# Patient Record
Sex: Female | Born: 1985 | Race: White | Hispanic: Yes | Marital: Married | Smoking: Current every day smoker
Health system: Southern US, Community
[De-identification: ages and names within clinical notes are randomized; demographics above are authoritative.]

## PROBLEM LIST (undated history)

## (undated) DIAGNOSIS — F419 Anxiety disorder, unspecified: Secondary | ICD-10-CM

## (undated) DIAGNOSIS — E079 Disorder of thyroid, unspecified: Secondary | ICD-10-CM

## (undated) DIAGNOSIS — E559 Vitamin D deficiency, unspecified: Secondary | ICD-10-CM

## (undated) HISTORY — DX: Vitamin D deficiency, unspecified: E55.9

## (undated) HISTORY — DX: Disorder of thyroid, unspecified: E07.9

## (undated) HISTORY — PX: EYE SURGERY: SHX253

## (undated) HISTORY — DX: Anxiety disorder, unspecified: F41.9

---

## 2014-03-04 HISTORY — PX: TUBAL LIGATION: SHX77

## 2015-01-03 ENCOUNTER — Ambulatory Visit (INDEPENDENT_AMBULATORY_CARE_PROVIDER_SITE_OTHER): Payer: BLUE CROSS/BLUE SHIELD | Admitting: Family Medicine

## 2015-01-03 ENCOUNTER — Encounter: Payer: Self-pay | Admitting: Family Medicine

## 2015-01-03 VITALS — BP 120/80 | HR 84 | Temp 98.6°F | Resp 16 | Ht 61.0 in | Wt 134.2 lb

## 2015-01-03 DIAGNOSIS — E079 Disorder of thyroid, unspecified: Secondary | ICD-10-CM | POA: Insufficient documentation

## 2015-01-03 DIAGNOSIS — E041 Nontoxic single thyroid nodule: Secondary | ICD-10-CM

## 2015-01-03 DIAGNOSIS — E049 Nontoxic goiter, unspecified: Secondary | ICD-10-CM | POA: Diagnosis not present

## 2015-01-03 NOTE — Progress Notes (Signed)
Name: Tamara NobleCynthia Monty   MRN: [id number]    DOB: [birth year]   Date:01/03/2015       Progress Note  Subjective  Chief Complaint  Chief Complaint  Patient presents with  . Thyroid Nodule    new patient to get established, seen in ER would like a referral to Endocrinology    Thyroid Problem Presents for follow-up visit. Symptoms include cold intolerance (' I'm cold all the time') and fatigue. Patient reports no palpitations or weight loss. ([place] [facility] for slowly enlarging mass in the neck. TSH was elevated, Free T4 was suppressed, and Thyroid Peroxidase Ab was elevated. Advised to obtain a referral from the PCP to be seen by Endocrinology.) Past treatments include nothing.  No Hx of thyroid disease in the family.  CTA of neck showed a homogeneously enlarged thyroid gland with a 4 mm nodule in posterior right lobe. History reviewed. No pertinent past medical history.  Past Surgical History  Procedure Laterality Date  . Cesarean section    . Eye surgery      Family History  Problem Relation Age of Onset  . Dementia Father     Social History   Social History  . Marital Status: Married    Spouse Name: N/A  . Number of Children: N/A  . Years of Education: N/A   Occupational History  . Not on file.   Social History Main Topics  . Smoking status: Current Every Day Smoker -- 0.25 packs/day    Types: Cigarettes  . Smokeless tobacco: Not on file  . Alcohol Use: No     Comment: occasional 1-2x/yr  . Drug Use: No  . Sexual Activity:    Partners: Male   Other Topics Concern  . Not on file   Social History Narrative  . No narrative on file    No current outpatient prescriptions on file.  No Known Allergies   Review of Systems  Constitutional: Positive for malaise/fatigue and fatigue. Negative for fever, chills and weight loss.  Eyes: Negative for blurred vision.  Cardiovascular: Negative for chest pain and palpitations.  Gastrointestinal: Negative for  abdominal pain.  Neurological: Negative for headaches.  Endo/Heme/Allergies: Positive for cold intolerance (' I'm cold all the time').    Objective  Filed Vitals:   01/03/15 0848  BP: 120/80  Pulse: 84  Temp: 98.6 F (37 C)  TempSrc: Oral  Resp: 16  Height: 5\' 1"  (1.549 m)  Weight: 134 lb 3.2 oz (60.873 kg)  SpO2: 98%    Physical Exam  Constitutional: She is oriented to person, place, and time and well-developed, well-nourished, and in no distress.  HENT:  Head: Normocephalic and atraumatic.  Mouth/Throat: Posterior oropharyngeal erythema present.  Neck: Thyroid mass and thyromegaly (enlarged thyroid gland, mildly tender to palpation) present.    Cardiovascular: Normal rate, regular rhythm and normal heart sounds.   Pulmonary/Chest: Effort normal and breath sounds normal. No stridor. She has no wheezes. She has no rales.  Abdominal: Soft. Bowel sounds are normal. There is no tenderness.  Neurological: She is alert and oriented to person, place, and time.  Skin: Skin is warm and dry.  Nursing note and vitals reviewed.   Assessment & Plan  1. Goiter [facility] provided. - Ambulatory referral to Endocrinology  2. Thyroid nodule Further workup and assessment by endocrinology. - Ambulatory referral to Endocrinology   [facility] Asad A. Faylene KurtzShah [facility] 01/03/2015 9:16 AM

## 2015-01-09 ENCOUNTER — Ambulatory Visit (INDEPENDENT_AMBULATORY_CARE_PROVIDER_SITE_OTHER): Payer: BLUE CROSS/BLUE SHIELD | Admitting: Family Medicine

## 2015-01-09 ENCOUNTER — Encounter: Payer: Self-pay | Admitting: Family Medicine

## 2015-01-09 VITALS — BP 120/77 | HR 91 | Temp 98.9°F | Resp 19 | Ht 61.0 in | Wt 133.8 lb

## 2015-01-09 DIAGNOSIS — J0110 Acute frontal sinusitis, unspecified: Secondary | ICD-10-CM

## 2015-01-09 MED ORDER — AMOXICILLIN-POT CLAVULANATE 875-125 MG PO TABS: 1.0000 | ORAL_TABLET | Freq: Two times a day (BID) | ORAL | Status: DC

## 2015-01-09 MED ORDER — FLUTICASONE PROPIONATE 50 MCG/ACT NA SUSP: 2.0000 | Freq: Every day | NASAL | Status: DC

## 2015-01-09 NOTE — Progress Notes (Signed)
Name: Tamara Walker   MRN: [id number]    DOB: Jul 09, 1985   Date:01/09/2015       Progress Note  Subjective  Chief Complaint  Chief Complaint  Patient presents with  . Acute Visit    Respitory Issue x4 days    URI  This is a new problem. Episode onset: 4 days ago. There has been no fever. Associated symptoms include congestion, coughing, sinus pain and a sore throat. She has tried decongestant for the symptoms.   History reviewed. No pertinent past medical history.  Past Surgical History  Procedure Laterality Date  . Cesarean section    . Eye surgery      Family History  Problem Relation Age of Onset  . Dementia Father     Social History   Social History  . Marital Status: Married    Spouse Name: N/A  . Number of Children: N/A  . Years of Education: N/A   Occupational History  . Not on file.   Social History Main Topics  . Smoking status: Current Every Day Smoker -- 0.25 packs/day    Types: Cigarettes  . Smokeless tobacco: Not on file  . Alcohol Use: No     Comment: occasional 1-2x/yr  . Drug Use: No  . Sexual Activity:    Partners: Male   Other Topics Concern  . Not on file   Social History Narrative     Current outpatient prescriptions:  .  levothyroxine (SYNTHROID, LEVOTHROID) 100 MCG tablet, Take 1 tablet by mouth daily., Disp: , Rfl:   No Known Allergies   Review of Systems  Constitutional: Positive for chills. Negative for fever.  HENT: Positive for congestion and sore throat.   Respiratory: Positive for cough.     Objective  Filed Vitals:   01/09/15 1115  BP: 120/77  Pulse: 91  Temp: 98.9 F (37.2 C)  TempSrc: Oral  Resp: 19  Height: 5\' 1"  (1.549 m)  Weight: 133 lb 12.8 oz (60.691 kg)  SpO2: 98%    Physical Exam  Constitutional: She is well-developed, well-nourished, and in no distress.  HENT:  Head: Normocephalic and atraumatic.  Right Ear: Tympanic membrane and ear canal normal.  Left Ear: Tympanic membrane and ear canal  normal.  Nose: Right sinus exhibits frontal sinus tenderness. Right sinus exhibits no maxillary sinus tenderness. Left sinus exhibits frontal sinus tenderness. Left sinus exhibits no maxillary sinus tenderness.  Mouth/Throat: Posterior oropharyngeal erythema present. No oropharyngeal exudate.  Neck: Thyromegaly present.  No palpable lymphadenopathy.  Cardiovascular: Normal rate, regular rhythm and normal heart sounds.   Pulmonary/Chest: Effort normal and breath sounds normal. She has no rales.  Nursing note and vitals reviewed.   Assessment & Plan  1. Acute frontal sinusitis, recurrence not specified Symptoms consistent with acute sinusitis. Will start on antibiotic therapy and Flonase for relief. Advised to increase fluid intake. Follow-up if no clinical improvement. - amoxicillin-clavulanate (AUGMENTIN) 875-125 MG tablet; Take 1 tablet by mouth 2 (two) times daily.  Dispense: 20 tablet; Refill: 0 - fluticasone (FLONASE) 50 MCG/ACT nasal spray; Place 2 sprays into both nostrils daily.  Dispense: 16 g; Refill: 6   Cheronda Erck Asad A. Faylene KurtzShah [facility] 01/09/2015 11:59 AM

## 2015-03-07 ENCOUNTER — Ambulatory Visit (INDEPENDENT_AMBULATORY_CARE_PROVIDER_SITE_OTHER): Payer: BLUE CROSS/BLUE SHIELD | Admitting: Certified Nurse Midwife

## 2015-03-07 ENCOUNTER — Encounter: Payer: Self-pay | Admitting: Certified Nurse Midwife

## 2015-03-07 VITALS — BP 120/91 | HR 80 | Ht 62.0 in | Wt 134.1 lb

## 2015-03-07 DIAGNOSIS — A499 Bacterial infection, unspecified: Secondary | ICD-10-CM

## 2015-03-07 DIAGNOSIS — N760 Acute vaginitis: Secondary | ICD-10-CM

## 2015-03-07 DIAGNOSIS — B9689 Other specified bacterial agents as the cause of diseases classified elsewhere: Secondary | ICD-10-CM

## 2015-03-07 MED ORDER — METRONIDAZOLE 500 MG PO TABS: 500.0000 mg | ORAL_TABLET | Freq: Two times a day (BID) | ORAL | Status: DC

## 2015-03-07 NOTE — Patient Instructions (Signed)

## 2015-03-07 NOTE — Progress Notes (Signed)
Subjective:     Patient ID: Tamara Walker, female   DOB: [birth year], 30 y.o.   MRN: [id number]  HPI Patient complains of a vaginal odor for 7 days. She just started her period today.  Nothing helps the vaginal odor.  She has had BV in the past.  She denies itch.  She denies any need for STI cultures.  She states her and her husband are monogamous.  Review of Systems  Constitutional: Negative for fever.  Respiratory: Negative for cough.   Cardiovascular: Negative for chest pain.  Gastrointestinal: Negative for abdominal pain, constipation and abdominal distention.  Genitourinary: Negative for flank pain, vaginal bleeding, vaginal discharge, difficulty urinating, genital sores, menstrual problem and pelvic pain.  Musculoskeletal: Negative for back pain.       Objective:   Physical Exam  Constitutional: She is oriented to person, place, and time. She appears well-developed and well-nourished.  Abdominal: Soft. She exhibits no distension. There is no tenderness. There is no rebound, no guarding and no CVA tenderness. Hernia confirmed negative in the right inguinal area and confirmed negative in the left inguinal area.  Genitourinary: There is no rash, tenderness or lesion on the right labia. There is no rash, tenderness or lesion on the left labia. Uterus is not enlarged and not tender. Cervix exhibits no motion tenderness and no discharge. Right adnexum displays no mass and no tenderness. Left adnexum displays no mass and no tenderness. There is bleeding in the vagina. No tenderness in the vagina. No signs of injury around the vagina. Vaginal discharge found.  Lymphadenopathy:       Right: No inguinal adenopathy present.       Left: No inguinal adenopathy present.  Neurological: She is alert and oriented to person, place, and time. She has normal reflexes.  Skin: Skin is warm and dry.  Psychiatric: She has a normal mood and affect. Her behavior is normal. Judgment and thought content normal.   Nursing note and vitals reviewed.      Assessment:     BV     Plan:     Flagyl  A total of 30 minutes were spent face-to-face with the patient during the encounter with greater than 50% dealing with counseling and coordination of care.

## 2015-04-28 ENCOUNTER — Ambulatory Visit (INDEPENDENT_AMBULATORY_CARE_PROVIDER_SITE_OTHER): Payer: BLUE CROSS/BLUE SHIELD | Admitting: Obstetrics and Gynecology

## 2015-04-28 ENCOUNTER — Encounter: Payer: Self-pay | Admitting: Obstetrics and Gynecology

## 2015-04-28 ENCOUNTER — Other Ambulatory Visit: Payer: Self-pay | Admitting: Obstetrics and Gynecology

## 2015-04-28 VITALS — BP 108/76 | HR 72 | Ht 61.5 in | Wt 140.1 lb

## 2015-04-28 DIAGNOSIS — Z01419 Encounter for gynecological examination (general) (routine) without abnormal findings: Secondary | ICD-10-CM

## 2015-04-28 NOTE — Progress Notes (Signed)
Subjective:   Tamara Walker is a 30 y.o. [phone] African American female here for a routine well-woman exam.  Patient's last menstrual period was 03/07/2015 (exact date).    Current complaints: none PCP: Sherryll Burger       Doesn't need labs  Social History: Sexual: heterosexual Marital Status: married Living situation: with family Occupation: homemaker Tobacco/alcohol: no alcohol use Illicit drugs: no history of illicit drug use  The following portions of the patient's history were reviewed and updated as appropriate: allergies, current medications, past family history, past medical history, past social history, past surgical history and problem list.  Past Medical History Past Medical History  Diagnosis Date  . Thyroid disease     Past Surgical History Past Surgical History  Procedure Laterality Date  . Cesarean section    . Eye surgery    . Tubal ligation  2016    Gynecologic History [id number]  Patient's last menstrual period was 03/07/2015 (exact date). Contraception: tubal ligation Last Pap: 2015. Results were: normal *  Obstetric History OB History  Gravida Para Term Preterm AB SAB TAB Ectopic Multiple Living  # Outcome Date GA Lbr Len/2nd Weight Sex Delivery Anes PTL Lv  5 Term 04/11/14    Judie Petit CS-Unspec   Y  4 TAB 2013 [redacted]w[redacted]d         3 Term 08/15/08    F Vag-Spont   Y  2 Term 04/07/05    F Vag-Spont   Y  1 SAB 2004 [redacted]w[redacted]d             Current Medications Current Outpatient Prescriptions on File Prior to Visit  Medication Sig Dispense Refill  . levothyroxine (SYNTHROID, LEVOTHROID) 100 MCG tablet Take 1 tablet by mouth daily.     No current facility-administered medications on file prior to visit.    Review of Systems Patient denies any headaches, blurred vision, shortness of breath, chest pain, abdominal pain, problems with bowel movements, urination, or intercourse.  Objective:  BP 108/76 mmHg  Pulse 72  Ht 5' 1.5" (1.562 m)  Wt 140 lb 1.6  oz (63.549 kg)  BMI 26.05 kg/m2  LMP 03/07/2015 (Exact Date)  Breastfeeding? No Physical Exam  General:  Well developed, well nourished, no acute distress. She is alert and oriented x3. Skin:  Warm and dry Neck:  Midline trachea, no thyromegaly or nodules Cardiovascular: Regular rate and rhythm, no murmur heard Lungs:  Effort normal, all lung fields clear to auscultation bilaterally Breasts:  No dominant palpable mass, retraction, or nipple discharge Abdomen:  Soft, non tender, no hepatosplenomegaly or masses Pelvic:  External genitalia is normal in appearance.  The vagina is normal in appearance. The cervix is bulbous, no CMT.  Thin prep pap is done . Uterus is felt to be normal size, shape, and contour.  No adnexal masses or tenderness noted. White copiuos d/c noted Microscopic wet-mount exam shows negative for pathogens, normal epithelial cells.  Extremities:  No swelling or varicosities noted Psych:  She has a normal mood and affect  Assessment:   Healthy well-woman exam  Plan:  Reassured, pap obtained F/U 1 year for AE, or sooner if needed s  Melody Suzan Nailer, CNM

## 2015-04-28 NOTE — Patient Instructions (Signed)
  Place annual gynecologic exam patient instructions here.  Thank you for enrolling in MyChart. Please follow the instructions below to securely access your online medical record. MyChart allows you to send messages to your doctor, view your test results, manage appointments, and more.   How Do I Sign Up? 1. In your Internet browser, go to [employer] and enter [email or web address]. 2. Click on the Sign Up Now link in the Sign In box. You will see the New Member Sign Up page. 3. Enter your MyChart Access Code exactly as it appears below. You will not need to use this code after you've completed the sign-up process. If you do not sign up before the expiration date, you must request a new code.  MyChart Access Code: [email or web address] Expires: 05/05/2015  3:06 AM  4. Enter your Social Security Number ([email or web address]) and Date of Birth (mm/dd/yyyy) as indicated and click Submit. You will be taken to the next sign-up page. 5. Create a MyChart ID. This will be your MyChart login ID and cannot be changed, so think of one that is secure and easy to remember. 6. Create a MyChart password. You can change your password at any time. 7. Enter your Password Reset Question and Answer. This can be used at a later time if you forget your password.  8. Enter your e-mail address. You will receive e-mail notification when new information is available in MyChart. 9. Click Sign Up. You can now view your medical record.   Additional Information Remember, MyChart is NOT to be used for urgent needs. For medical emergencies, dial 911.

## 2015-05-02 LAB — CYTOLOGY - PAP

## 2015-10-31 ENCOUNTER — Encounter: Payer: Self-pay | Admitting: Family Medicine

## 2015-10-31 ENCOUNTER — Ambulatory Visit (INDEPENDENT_AMBULATORY_CARE_PROVIDER_SITE_OTHER): Payer: BLUE CROSS/BLUE SHIELD | Admitting: Family Medicine

## 2015-10-31 VITALS — BP 103/66 | HR 75 | Temp 98.0°F | Resp 15 | Ht 62.0 in | Wt 138.6 lb

## 2015-10-31 DIAGNOSIS — E063 Autoimmune thyroiditis: Secondary | ICD-10-CM | POA: Diagnosis not present

## 2015-10-31 DIAGNOSIS — E039 Hypothyroidism, unspecified: Secondary | ICD-10-CM

## 2015-10-31 NOTE — Progress Notes (Signed)
Name: Tamara NobleCynthia Sova   MRN: [id number]    DOB: Apr 05, 1985   Date:10/31/2015       Progress Note  Subjective  Chief Complaint  Chief Complaint  Patient presents with  . Labs Only    Thyroid level recheck    HPI  Pt. Presents for rechecking thyroid levels, being followed by Dr. Tedd SiasSolum (Endocrinology) after she was referred for goiter, TSH was elevated and was started on Levothyroxine 100 mcg every morning. She feels that her symptoms of fatigue and cold intolerance are still there but constipation has improved.    Past Medical History:  Diagnosis Date  . Thyroid disease     Past Surgical History:  Procedure Laterality Date  . CESAREAN SECTION    . EYE SURGERY    . TUBAL LIGATION  2016    Family History  Problem Relation Age of Onset  . Dementia Father   . Diabetes Maternal Grandfather   . Diabetes Paternal Grandmother   . Cancer Neg Hx   . Heart disease Neg Hx     Social History   Social History  . Marital status: Married    Spouse name: N/A  . Number of children: N/A  . Years of education: N/A   Occupational History  . Not on file.   Social History Main Topics  . Smoking status: Current Every Day Smoker    Packs/day: 0.25    Types: Cigarettes  . Smokeless tobacco: Never Used  . Alcohol use No     Comment: occasional 1-2x/yr  . Drug use: No  . Sexual activity: Yes    Partners: Male    Birth control/ protection: Surgical   Other Topics Concern  . Not on file   Social History Narrative  . No narrative on file     Current Outpatient Prescriptions:  .  levothyroxine (SYNTHROID, LEVOTHROID) 100 MCG tablet, Take 1 tablet by mouth daily., Disp: , Rfl:   No Known Allergies   Review of Systems  Constitutional: Positive for malaise/fatigue. Negative for chills and fever.  Gastrointestinal: Positive for constipation.    Objective  Vitals:   10/31/15 1559  BP: 103/66  Pulse: 75  Resp: 15  Temp: 98 F (36.7 C)  TempSrc: Oral  SpO2: 98%  Weight:  138 lb 9.6 oz (62.9 kg)  Height: 5\' 2"  (1.575 m)    Physical Exam  Constitutional: She is well-developed, well-nourished, and in no distress.  Neck: Neck supple. Thyromegaly (mildly enlarged thyroid gland, no nodules, ) present. No thyroid mass present.  Cardiovascular: Normal rate, regular rhythm, S1 normal, S2 normal and normal heart sounds.   No murmur heard. Pulmonary/Chest: Effort normal and breath sounds normal. She has no wheezes.  Psychiatric: Mood, memory, affect and judgment normal.  Nursing note and vitals reviewed.    Assessment & Plan  1. Hashimoto's thyroiditis Resolved, patient is now hypothyroid on replacement therapy.  2. Hypothyroidism, unspecified hypothyroidism type Obtain thyroid panel, adjust levothyroxine as appropriate. - TSH - T4, free - T3 Uptake   Devondre Guzzetta Asad A. Faylene KurtzShah [facility] 10/31/2015 4:19 PM

## 2015-11-03 ENCOUNTER — Ambulatory Visit: Payer: BLUE CROSS/BLUE SHIELD | Admitting: Family Medicine

## 2015-11-04 LAB — TSH: TSH: 1.87 mIU/L

## 2015-11-04 LAB — T3 UPTAKE: T3 UPTAKE: 30 % (ref 22–35)

## 2015-11-04 LAB — T4, FREE: Free T4: 1.4 ng/dL (ref 0.8–1.8)

## 2016-01-03 ENCOUNTER — Ambulatory Visit
Admission: RE | Admit: 2016-01-03 | Discharge: 2016-01-03 | Disposition: A | Discharge status: Home / Self Care | Payer: BLUE CROSS/BLUE SHIELD | Source: Ambulatory Visit | Attending: Family Medicine | Admitting: Family Medicine

## 2016-01-03 ENCOUNTER — Encounter: Payer: Self-pay | Admitting: Family Medicine

## 2016-01-03 ENCOUNTER — Ambulatory Visit (INDEPENDENT_AMBULATORY_CARE_PROVIDER_SITE_OTHER): Payer: BLUE CROSS/BLUE SHIELD | Admitting: Family Medicine

## 2016-01-03 VITALS — BP 108/68 | HR 72 | Temp 98.0°F | Resp 16 | Ht 62.0 in | Wt 138.0 lb

## 2016-01-03 DIAGNOSIS — E063 Autoimmune thyroiditis: Secondary | ICD-10-CM

## 2016-01-03 DIAGNOSIS — R071 Chest pain on breathing: Secondary | ICD-10-CM | POA: Insufficient documentation

## 2016-01-03 DIAGNOSIS — M940 Chondrocostal junction syndrome [Tietze]: Secondary | ICD-10-CM | POA: Diagnosis not present

## 2016-01-03 MED ORDER — NAPROXEN 500 MG PO TABS: 500.0000 mg | ORAL_TABLET | Freq: Two times a day (BID) | ORAL | 0 refills | Status: DC

## 2016-01-03 MED ORDER — LEVOTHYROXINE SODIUM 100 MCG PO TABS: 100.0000 ug | ORAL_TABLET | Freq: Every day | ORAL | 1 refills | Status: DC

## 2016-01-03 NOTE — Progress Notes (Signed)
Name: Tamara Walker   MRN: [id number]    DOB: 08-11-1985   Date:01/03/2016       Progress Note  Subjective  Chief Complaint  Chief Complaint  Patient presents with  . Chest Pain    x2 days    Chest Pain   This is a new problem. The current episode started yesterday. The onset quality is sudden. The problem occurs constantly. The pain is moderate. The quality of the pain is described as sharp. The pain does not radiate. Pertinent negatives include no cough, fever, headaches, leg pain, near-syncope, numbness (had numbness in her left hand last night, then resolved.), orthopnea, palpitations, shortness of breath or sputum production. She has tried nothing for the symptoms.  Pertinent negatives for past medical history include no arrhythmia, no CAD and no MI. Valve disorder: father had multiple heart attacks.  Her family medical history is significant for CAD and early MI.      Past Medical History:  Diagnosis Date  . Thyroid disease     Past Surgical History:  Procedure Laterality Date  . CESAREAN SECTION    . EYE SURGERY    . TUBAL LIGATION  2016    Family History  Problem Relation Age of Onset  . Dementia Father   . Diabetes Maternal Grandfather   . Diabetes Paternal Grandmother   . Cancer Neg Hx   . Heart disease Neg Hx     Social History   Social History  . Marital status: Married    Spouse name: N/A  . Number of children: N/A  . Years of education: N/A   Occupational History  . Not on file.   Social History Main Topics  . Smoking status: Current Every Day Smoker    Packs/day: 0.25    Types: Cigarettes  . Smokeless tobacco: Never Used  . Alcohol use No     Comment: occasional 1-2x/yr  . Drug use: No  . Sexual activity: Yes    Partners: Male    Birth control/ protection: Surgical   Other Topics Concern  . Not on file   Social History Narrative  . No narrative on file     Current Outpatient Prescriptions:  .  levothyroxine (SYNTHROID, LEVOTHROID)  100 MCG tablet, Take 1 tablet by mouth daily., Disp: , Rfl:   No Known Allergies   Review of Systems  Constitutional: Negative for fever.  Eyes: Negative for blurred vision and double vision.  Respiratory: Negative for cough, sputum production and shortness of breath.   Cardiovascular: Positive for chest pain. Negative for palpitations, orthopnea and near-syncope.  Neurological: Negative for numbness (had numbness in her left hand last night, then resolved.) and headaches.    Objective  Vitals:   01/03/16 1523  BP: 108/68  Pulse: 72  Resp: 16  Temp: 98 F (36.7 C)  TempSrc: Oral  SpO2: 97%  Weight: 138 lb (62.6 kg)  Height: 5\' 2"  (1.575 m)    Physical Exam  Constitutional: She is well-developed, well-nourished, and in no distress.  Cardiovascular: Normal rate, regular rhythm, S1 normal and S2 normal.   Murmur heard.  Systolic murmur is present with a grade of 2/6  Pulmonary/Chest: Effort normal and breath sounds normal. No respiratory distress. She has no decreased breath sounds. She has no wheezes. She has no rhonchi. She exhibits tenderness.    Musculoskeletal:       Right ankle: She exhibits no swelling.       Left ankle: She exhibits no swelling.  Nursing note and vitals reviewed.     Assessment & Plan  1. Chest pain made worse by breathing EKG is normal sinus rhythm, pain likely pleuritic in nature. Obtain chest x-ray and referred to cardiology - DG Chest 2 View; Future - EKG 12-Lead - Ambulatory referral to Cardiology  2. Acute costochondritis Start on NSAID therapy for one week for relief - naproxen (NAPROSYN) 500 MG tablet; Take 1 tablet (500 mg total) by mouth 2 (two) times daily with a meal.  Dispense: 14 tablet; Refill: 0  3. Hashimoto's thyroiditis  - levothyroxine (SYNTHROID, LEVOTHROID) 100 MCG tablet; Take 1 tablet (100 mcg total) by mouth daily.  Dispense: 90 tablet; Refill: 1   Louis Gaw Asad A. Faylene KurtzShah [facility] 01/03/2016 3:49 PM

## 2016-01-09 ENCOUNTER — Encounter: Payer: Self-pay | Admitting: Cardiology

## 2016-01-09 ENCOUNTER — Ambulatory Visit (INDEPENDENT_AMBULATORY_CARE_PROVIDER_SITE_OTHER): Payer: BLUE CROSS/BLUE SHIELD | Admitting: Cardiology

## 2016-01-09 VITALS — BP 126/90 | HR 63 | Ht 62.0 in | Wt 140.8 lb

## 2016-01-09 DIAGNOSIS — F17200 Nicotine dependence, unspecified, uncomplicated: Secondary | ICD-10-CM

## 2016-01-09 DIAGNOSIS — R079 Chest pain, unspecified: Secondary | ICD-10-CM | POA: Diagnosis not present

## 2016-01-09 MED ORDER — OMEPRAZOLE 20 MG PO CPDR: 20.0000 mg | DELAYED_RELEASE_CAPSULE | Freq: Every day | ORAL | 0 refills | Status: DC

## 2016-01-09 NOTE — Patient Instructions (Addendum)
Medication Instructions:  Your physician has recommended you make the following change in your medication:  1. Take ibuprofen 200 mg 3 tablets three times a day for 2 weeks. 2. Take Prilosec 20 mg once daily while taking ibuprofen.  Testing/Procedures: Your physician has requested that you have an echocardiogram. Echocardiography is a painless test that uses sound waves to create images of your heart. It provides your doctor with information about the size and shape of your heart and how well your heart's chambers and valves are working. This procedure takes approximately one hour. There are no restrictions for this procedure.   Follow-Up: Your physician recommends that you schedule a follow-up appointment after echocardiogram. If pain has resolved then you may cancel this appointment.   It was a pleasure seeing you today here in the office. Please do not hesitate to give us a call back if you have any further questions. [phone]  [place] CellarPamela A. RN, BSN     Echocardiogram An echocardiogram, or echocardiography, uses sound waves (ultrasound) to produce an image of your heart. The echocardiogram is simple, painless, obtained within a short period of time, and offers valuable information to your health care provider. The images from an echocardiogram can provide information such as:  Evidence of coronary artery disease (CAD).  Heart size.  Heart muscle function.  Heart valve function.  Aneurysm detection.  Evidence of a past heart attack.  Fluid buildup around the heart.  Heart muscle thickening.  Assess heart valve function. LET [facility] KNOW ABOUT:  Any allergies you have.  All medicines you are taking, including vitamins, herbs, eye drops, creams, and over-the-counter medicines.  Previous problems you or members of your family have had with the use of anesthetics.  Any blood disorders you have.  Previous surgeries you have had.  Medical conditions  you have.  Possibility of pregnancy, if this applies. BEFORE THE PROCEDURE  No special preparation is needed. Eat and drink normally.  PROCEDURE   In order to produce an image of your heart, gel will be applied to your chest and a wand-like tool (transducer) will be moved over your chest. The gel will help transmit the sound waves from the transducer. The sound waves will harmlessly bounce off your heart to allow the heart images to be captured in real-time motion. These images will then be recorded.  You may need an IV to receive a medicine that improves the quality of the pictures. AFTER THE PROCEDURE You may return to your normal schedule including diet, activities, and medicines, unless your health care provider tells you otherwise.   This information is not intended to replace advice given to you by your health care provider. Make sure you discuss any questions you have with your health care provider.   Document Released: 02/16/2000 Document Revised: 03/11/2014 Document Reviewed: 10/26/2012 Elsevier Interactive Patient Education [employer]2016 [employer] Inc.

## 2016-01-09 NOTE — Progress Notes (Signed)
Cardiology Office Note   Date:  01/09/2016   ID:  Tamara NobleCynthia Stanislaw, DOB 15-Oct-1985, MRN [id number]  Referring Doctor:  Brayton ElSyed Shah, MD   Cardiologist:   Almond LintAileen Harvis Mabus, MD   Reason for consultation:  Chief Complaint  Patient presents with  . New Patient (Initial Visit)    patient having cp and sob. patient states pain is a "sharp" pain beside of her breast.      History of Present Illness: Tamara Walker is a 30 y.o. female who presents for Chest pain. Onset was on Halloween, right before trick-or-treating with kids, she had sudden onset of sharp chest pain. This was center of the chest, nonradiating. It was moderate in intensity, continuous since one week ago. Made worse with bending forward, lying on left side, leaning back. Also worsens with deep breathing. No exacerbation with walking. Some shortness of breath since that time as well.  Patient denies PND, orthopnea, edema. No palpitations.   ROS:  Please see the history of present illness. Aside from mentioned under HPI, all other systems are reviewed and negative.     Past Medical History:  Diagnosis Date  . Thyroid disease     Past Surgical History:  Procedure Laterality Date  . CESAREAN SECTION    . EYE SURGERY    . TUBAL LIGATION  2016     reports that she has been smoking Cigarettes.  She has been smoking about 0.25 packs per day. She has never used smokeless tobacco. She reports that she does not drink alcohol or use drugs.   family history includes Dementia in her father; Diabetes in her maternal grandfather and paternal grandmother.   Outpatient Medications Prior to Visit  Medication Sig Dispense Refill  . levothyroxine (SYNTHROID, LEVOTHROID) 100 MCG tablet Take 1 tablet (100 mcg total) by mouth daily. 90 tablet 1  . naproxen (NAPROSYN) 500 MG tablet Take 1 tablet (500 mg total) by mouth 2 (two) times daily with a meal. 14 tablet 0   No facility-administered medications prior to visit.      Allergies:  Patient has no known allergies.    PHYSICAL EXAM: VS:  BP 126/90 (BP Location: Right Arm, Cuff Size: Normal)   Pulse 63   Ht 5\' 2"  (1.575 m)   Wt 140 lb 12.8 oz (63.9 kg)   LMP 12/28/2015 (Exact Date)   BMI 25.75 kg/m  , Body mass index is 25.75 kg/m. Wt Readings from Last 3 Encounters:  01/09/16 140 lb 12.8 oz (63.9 kg)  01/03/16 138 lb (62.6 kg)  10/31/15 138 lb 9.6 oz (62.9 kg)    GENERAL:  well developed, well nourished, not in acute distress HEENT: normocephalic, pink conjunctivae, anicteric sclerae, no xanthelasma, normal dentition, oropharynx clear NECK:  no neck vein engorgement, JVP normal, no hepatojugular reflux, carotid upstroke brisk and symmetric, no bruit, no thyromegaly, no lymphadenopathy LUNGS:  good respiratory effort, clear to auscultation bilaterally CV:  PMI not displaced, no thrills, no lifts, S1 and S2 within normal limits, no palpable S3 or S4, no murmurs, no rubs, no gallops ABD:  Soft, nontender, nondistended, normoactive bowel sounds, no abdominal aortic bruit, no hepatomegaly, no splenomegaly MS: nontender back, no kyphosis, no scoliosis, no joint deformities EXT:  2+ DP/PT pulses, no edema, no varicosities, no cyanosis, no clubbing SKIN: warm, nondiaphoretic, normal turgor, no ulcers NEUROPSYCH: alert, oriented to person, place, and time, sensory/motor grossly intact, normal mood, appropriate affect  Recent Labs: 11/03/2015: TSH 1.87   Lipid Panel No results  found for: CHOL, TRIG, HDL, CHOLHDL, VLDL, LDLCALC, LDLDIRECT   Other studies Reviewed:  EKG:  The ekg from 01/09/2016 was personally reviewed by me and it revealed sinus rhythm, 63 BPM. Early repolarization pattern.  Additional studies/ records that were reviewed personally reviewed by me today include: None available   ASSESSMENT AND PLAN: chest pain, history is more typical for possible pericarditis. There may be minimal PR depression on EKG but no clear evidence of pericarditis on  EKG. Agree with NSAIDs. Patient is about to finish one week of naproxen. Recommend after course of naproxen to start ibuprofen 600 mg by mouth 3 times a day with meals x 2 weeks. Also start Prilosec 20 mg by mouth every morning. ffup after if persistent symptoms. Recommend echocardiogram. Although she has risk factors for CAD, her symptomatology is not consistent with angina.  Tobacco use We discussed the importance of smoking cessation and different strategies for quitting.    Current medicines are reviewed at length with the patient today.  The patient does not have concerns regarding medicines.  Labs/ tests ordered today include:  Orders Placed This Encounter  Procedures  . EKG 12-Lead  . ECHOCARDIOGRAM COMPLETE    I had a lengthy and detailed discussion with the patient regarding diagnoses, prognosis, diagnostic options, treatment options , and side effects of medications.   I counseled the patient on importance of lifestyle modification including heart healthy diet, regular physical activity , and smoking cessation.   Disposition:   FU with undersigned after tests    Signed, Almond LintAileen Meara Wiechman, MD  01/09/2016 11:06 AM    [facility]  This note was generated in part with voice recognition software and I apologize for any typographical errors that were not detected and corrected.

## 2016-02-02 ENCOUNTER — Other Ambulatory Visit: Payer: BLUE CROSS/BLUE SHIELD

## 2016-02-06 ENCOUNTER — Ambulatory Visit: Payer: BLUE CROSS/BLUE SHIELD | Admitting: Cardiology

## 2016-02-28 ENCOUNTER — Encounter: Payer: Self-pay | Admitting: Cardiology

## 2016-05-03 ENCOUNTER — Ambulatory Visit (INDEPENDENT_AMBULATORY_CARE_PROVIDER_SITE_OTHER): Payer: BLUE CROSS/BLUE SHIELD | Admitting: Family Medicine

## 2016-05-03 ENCOUNTER — Encounter: Payer: BLUE CROSS/BLUE SHIELD | Admitting: Obstetrics and Gynecology

## 2016-05-03 ENCOUNTER — Encounter: Payer: Self-pay | Admitting: Family Medicine

## 2016-05-03 VITALS — BP 120/75 | HR 72 | Temp 98.3°F | Resp 16 | Ht 62.0 in | Wt 135.3 lb

## 2016-05-03 DIAGNOSIS — Z23 Encounter for immunization: Secondary | ICD-10-CM

## 2016-05-03 DIAGNOSIS — E063 Autoimmune thyroiditis: Secondary | ICD-10-CM | POA: Diagnosis not present

## 2016-05-03 DIAGNOSIS — Z833 Family history of diabetes mellitus: Secondary | ICD-10-CM

## 2016-05-03 DIAGNOSIS — Z0000 Encounter for general adult medical examination without abnormal findings: Secondary | ICD-10-CM | POA: Insufficient documentation

## 2016-05-03 LAB — CBC WITH DIFFERENTIAL/PLATELET
BASOS ABS: 0 {cells}/uL (ref 0–200)
Basophils Relative: 0 %
EOS ABS: 88 {cells}/uL (ref 15–500)
Eosinophils Relative: 1 %
HEMATOCRIT: 40.3 % (ref 35.0–45.0)
Hemoglobin: 12.4 g/dL (ref 11.7–15.5)
LYMPHS PCT: 29 %
Lymphs Abs: 2552 cells/uL (ref 850–3900)
MCH: 22.8 pg — AB (ref 27.0–33.0)
MCHC: 30.8 g/dL — AB (ref 32.0–36.0)
MCV: 74.2 fL — AB (ref 80.0–100.0)
MONO ABS: 440 {cells}/uL (ref 200–950)
MPV: 9.3 fL (ref 7.5–12.5)
Monocytes Relative: 5 %
NEUTROS PCT: 65 %
Neutro Abs: 5720 cells/uL (ref 1500–7800)
Platelets: 322 10*3/uL (ref 140–400)
RBC: 5.43 MIL/uL — ABNORMAL HIGH (ref 3.80–5.10)
RDW: 14.5 % (ref 11.0–15.0)
WBC: 8.8 10*3/uL (ref 3.8–10.8)

## 2016-05-03 LAB — COMPLETE METABOLIC PANEL WITH GFR
ALT: 13 U/L (ref 6–29)
AST: 15 U/L (ref 10–30)
Albumin: 4.6 g/dL (ref 3.6–5.1)
Alkaline Phosphatase: 78 U/L (ref 33–115)
BUN: 7 mg/dL (ref 7–25)
CALCIUM: 9.2 mg/dL (ref 8.6–10.2)
CHLORIDE: 105 mmol/L (ref 98–110)
CO2: 27 mmol/L (ref 20–31)
CREATININE: 0.68 mg/dL (ref 0.50–1.10)
Glucose, Bld: 99 mg/dL (ref 65–99)
Potassium: 4.2 mmol/L (ref 3.5–5.3)
Sodium: 139 mmol/L (ref 135–146)
Total Bilirubin: 0.3 mg/dL (ref 0.2–1.2)
Total Protein: 7.6 g/dL (ref 6.1–8.1)

## 2016-05-03 LAB — LIPID PANEL
Cholesterol: 188 mg/dL (ref <200)
HDL: 48 mg/dL — AB (ref >50)
LDL Cholesterol: 112 mg/dL — ABNORMAL HIGH (ref <100)
TRIGLYCERIDES: 138 mg/dL (ref <150)
Total CHOL/HDL Ratio: 3.9 Ratio (ref <5.0)
VLDL: 28 mg/dL (ref <30)

## 2016-05-03 LAB — TSH: TSH: 0.63 m[IU]/L

## 2016-05-03 NOTE — Progress Notes (Signed)
Name: Tamara Walker   MRN: [id number]    DOB: 11/15/1985   Date:05/03/2016       Progress Note  Subjective  Chief Complaint  Chief Complaint  Patient presents with  . Annual Exam    CPE w/o pap    Thyroid Problem  Presents for follow-up visit. Symptoms include anxiety, constipation, depressed mood, dry skin, fatigue, leg swelling and weight loss. Patient reports no diarrhea or palpitations. The symptoms have been stable.    Pt. Presents for Complete Physical Exam. She gets female exams and screening at the OB/GYN  Past Medical History:  Diagnosis Date  . Thyroid disease     Past Surgical History:  Procedure Laterality Date  . CESAREAN SECTION    . EYE SURGERY    . TUBAL LIGATION  2016    Family History  Problem Relation Age of Onset  . Dementia Father   . Diabetes Maternal Grandfather   . Diabetes Paternal Grandmother   . Cancer Neg Hx   . Heart disease Neg Hx     Social History   Social History  . Marital status: Married    Spouse name: N/A  . Number of children: N/A  . Years of education: N/A   Occupational History  . Not on file.   Social History Main Topics  . Smoking status: Current Every Day Smoker    Packs/day: 0.25    Types: Cigarettes  . Smokeless tobacco: Never Used  . Alcohol use No     Comment: occasional 1-2x/yr  . Drug use: No  . Sexual activity: Yes    Partners: Male    Birth control/ protection: Surgical   Other Topics Concern  . Not on file   Social History Narrative  . No narrative on file     Current Outpatient Prescriptions:  .  levothyroxine (SYNTHROID, LEVOTHROID) 100 MCG tablet, Take 1 tablet (100 mcg total) by mouth daily., Disp: 90 tablet, Rfl: 1  No Known Allergies   Review of Systems  Constitutional: Positive for fatigue and weight loss. Negative for chills, fever and malaise/fatigue.  HENT: Negative for congestion, ear pain and sore throat.   Eyes: Negative for blurred vision and double vision.  Respiratory:  Negative for cough, sputum production and shortness of breath.   Cardiovascular: Negative for chest pain, palpitations and leg swelling (feels that her feet are swollen).  Gastrointestinal: Positive for constipation. Negative for abdominal pain, blood in stool, diarrhea, nausea and vomiting.  Genitourinary: Negative for dysuria and hematuria.  Musculoskeletal: Negative for back pain and neck pain.  Neurological: Negative for dizziness and headaches.  Psychiatric/Behavioral: Positive for depression. The patient is nervous/anxious. The patient does not have insomnia.     Objective  Vitals:   05/03/16 1044  BP: 120/75  Pulse: 72  Resp: 16  Temp: 98.3 F (36.8 C)  TempSrc: Oral  SpO2: 96%  Weight: 135 lb 4.8 oz (61.4 kg)  Height: 5\' 2"  (1.575 m)    Physical Exam  Constitutional: She is oriented to person, place, and time and well-developed, well-nourished, and in no distress.  HENT:  Head: Normocephalic and atraumatic.  Eyes: Pupils are equal, round, and reactive to light.  Neck: Normal range of motion. No thyromegaly present.  Cardiovascular: Normal rate, regular rhythm and normal heart sounds.   No murmur heard. Pulmonary/Chest: Effort normal and breath sounds normal. She has no wheezes.  Abdominal: Soft. Bowel sounds are normal. There is no tenderness.  Musculoskeletal: Normal range of motion. She exhibits  edema (trace pitting edema bilateral LE).  Neurological: She is alert and oriented to person, place, and time.  Psychiatric: Mood, memory, affect and judgment normal.  Nursing note and vitals reviewed.       Assessment & Plan  1. Well woman exam without gynecological exam Obtain age appropriate laboratory screenings - CBC with Differential/Platelet - COMPLETE METABOLIC PANEL WITH GFR - Lipid panel - VITAMIN D 25 Hydroxy (Vit-D Deficiency, Fractures)  2. Hashimoto's thyroiditis She continues on levothyroxine 100 MCG daily, obtain TSH and follow-up - TSH  3.  Family history of diabetes mellitus in paternal grandmother Point-of-care A1c 6.0%, consistent with prediabetes - POCT glycosylated hemoglobin (Hb A1C)   4. Need for immunization against influenza  - Flu Vaccine QUAD 36+ mos PF IM (Fluarix & Fluzone Quad PF)  5. Need for diphtheria-tetanus-pertussis (Tdap) vaccine  - Tdap vaccine greater than or equal to 7yo IM   Vannary Greening Asad A. [facility] 05/03/2016 11:28 AM

## 2016-05-04 LAB — VITAMIN D 25 HYDROXY (VIT D DEFICIENCY, FRACTURES): VIT D 25 HYDROXY: 17 ng/mL — AB (ref 30–100)

## 2016-05-06 LAB — POCT GLYCOSYLATED HEMOGLOBIN (HGB A1C): HEMOGLOBIN A1C: 6

## 2016-05-07 ENCOUNTER — Other Ambulatory Visit: Payer: Self-pay | Admitting: Emergency Medicine

## 2016-05-07 MED ORDER — VITAMIN D (ERGOCALCIFEROL) 1.25 MG (50000 UNIT) PO CAPS: 50000.0000 [IU] | ORAL_CAPSULE | ORAL | 0 refills | Status: DC

## 2016-05-07 NOTE — Progress Notes (Unsigned)
v

## 2016-05-10 ENCOUNTER — Encounter: Payer: Self-pay | Admitting: Certified Nurse Midwife

## 2016-05-10 ENCOUNTER — Ambulatory Visit (INDEPENDENT_AMBULATORY_CARE_PROVIDER_SITE_OTHER): Payer: BLUE CROSS/BLUE SHIELD | Admitting: Certified Nurse Midwife

## 2016-05-10 VITALS — BP 102/65 | HR 88 | Ht 62.0 in | Wt 135.8 lb

## 2016-05-10 DIAGNOSIS — N898 Other specified noninflammatory disorders of vagina: Secondary | ICD-10-CM | POA: Diagnosis not present

## 2016-05-10 DIAGNOSIS — Z202 Contact with and (suspected) exposure to infections with a predominantly sexual mode of transmission: Secondary | ICD-10-CM

## 2016-05-10 DIAGNOSIS — R350 Frequency of micturition: Secondary | ICD-10-CM | POA: Diagnosis not present

## 2016-05-10 DIAGNOSIS — Z720 Tobacco use: Secondary | ICD-10-CM | POA: Diagnosis not present

## 2016-05-10 DIAGNOSIS — Z01419 Encounter for gynecological examination (general) (routine) without abnormal findings: Secondary | ICD-10-CM

## 2016-05-10 DIAGNOSIS — Z9851 Tubal ligation status: Secondary | ICD-10-CM | POA: Diagnosis not present

## 2016-05-10 LAB — POCT URINALYSIS DIPSTICK
Bilirubin, UA: NEGATIVE
Blood, UA: NEGATIVE
Glucose, UA: NEGATIVE
KETONES UA: NEGATIVE
LEUKOCYTES UA: NEGATIVE
NITRITE UA: NEGATIVE
PH UA: 6
PROTEIN UA: NEGATIVE
Spec Grav, UA: >=1.03
UROBILINOGEN UA: NEGATIVE

## 2016-05-10 NOTE — Progress Notes (Signed)
ANNUAL PREVENTATIVE CARE GYN  ENCOUNTER NOTE  Subjective:       Tamara Walker is a 31 y.o. [phone] female here for a routine annual gynecologic exam.  Current complaints: 1.  Decreased ability to hold urine  2.Vaginal discharge with odor    Gynecologic History No LMP recorded. Cycles are regular and normal Contraception: tubal ligation Last Pap: 05/02/15. Results were: normal Last mammogram: N/A.   Obstetric History OB History  Gravida Para Term Preterm AB Living  5 3 3   2 3   SAB TAB Ectopic Multiple Live Births  1 1     3     # Outcome Date GA Lbr Len/2nd Weight Sex Delivery Anes PTL Lv  5 Term 04/11/14    M CS-Unspec   LIV  4 TAB 2013 [redacted]w[redacted]d         3 Term 08/15/08    F Vag-Spont   LIV  2 Term 04/07/05    F Vag-Spont   LIV  1 SAB 2004 [redacted]w[redacted]d             Past Medical History:  Diagnosis Date  . Thyroid disease   . Vitamin D deficiency     Past Surgical History:  Procedure Laterality Date  . CESAREAN SECTION    . EYE SURGERY    . TUBAL LIGATION  2016    Current Outpatient Prescriptions on File Prior to Visit  Medication Sig Dispense Refill  . levothyroxine (SYNTHROID, LEVOTHROID) 100 MCG tablet Take 1 tablet (100 mcg total) by mouth daily. 90 tablet 1  . Vitamin D, Ergocalciferol, (DRISDOL) 50000 units CAPS capsule Take 1 capsule (50,000 Units total) by mouth every 7 (seven) days. 12 capsule 0   No current facility-administered medications on file prior to visit.     No Known Allergies  Social History   Social History  . Marital status: Married    Spouse name: N/A  . Number of children: N/A  . Years of education: N/A   Occupational History  . Not on file.   Social History Main Topics  . Smoking status: Current Every Day Smoker    Packs/day: 0.25    Types: Cigarettes  . Smokeless tobacco: Never Used  . Alcohol use 0.0 oz/week     Comment: occasional 1-2x/yr  . Drug use: No  . Sexual activity: Yes    Partners: Male    Birth control/ protection:  Surgical   Other Topics Concern  . Not on file   Social History Narrative  . No narrative on file    Family History  Problem Relation Age of Onset  . Dementia Father   . Diabetes Maternal Grandfather   . Diabetes Paternal Grandmother   . Cancer Neg Hx   . Heart disease Neg Hx     The following portions of the patient's history were reviewed and updated as appropriate: allergies, current medications, past family history, past medical history, past social history, past surgical history and problem list.  Review of Systems ROS Review of Systems - General ROS: negative for - chills, fatigue, fever, hot flashes, night sweats, weight gain or weight loss Psychological ROS: negative for - anxiety, decreased libido, depression, mood swings, physical abuse or sexual abuse Ophthalmic ROS: negative for - blurry vision, eye pain or loss of vision ENT ROS: negative for - headaches, hearing change, visual changes or vocal changes Allergy and Immunology ROS: negative for - hives, itchy/watery eyes or seasonal allergies Hematological and Lymphatic ROS: negative for - bleeding  problems, bruising, swollen lymph nodes or weight loss Endocrine ROS: negative for - galactorrhea, hair pattern changes, hot flashes, malaise/lethargy, mood swings, palpitations, polydipsia/polyuria, skin changes, temperature intolerance or unexpected weight changes Breast ROS: negative for - new or changing breast lumps or nipple discharge Respiratory ROS: negative for - cough or shortness of breath Cardiovascular ROS: negative for - chest pain, irregular heartbeat, palpitations or shortness of breath Gastrointestinal ROS: no abdominal pain, black or bloody stools. Positive for constipation Genito-Urinary ROS: no dysuria, trouble voiding, or hematuria Musculoskeletal ROS: negative for - joint pain or joint stiffness Neurological ROS: negative for - bowel and bladder control changes Dermatological ROS: negative for rash and  skin lesion changes   Objective:   BP 102/65   Pulse 88   Ht 5\' 2"  (1.575 m)   Wt 135 lb 12.8 oz (61.6 kg)   BMI 24.84 kg/m  CONSTITUTIONAL: Well-developed, well-nourished female in no acute distress.  PSYCHIATRIC: Normal mood and affect. Normal behavior. Normal judgment and thought content. NEUROLGIC: Alert and oriented to person, place, and time. Normal muscle tone coordination. No cranial nerve deficit noted. HENT:  Normocephalic, atraumatic, External right and left ear normal.  EYES: Conjunctivae and EOM are normal.  No scleral icterus.  NECK: Normal range of motion, supple, no masses.   Thyroid slight enlargement. Hx of goiter and thyroiditis. Currently treated by endocrinologist.  SKIN: Skin is warm and dry. No rash noted. Not diaphoretic. No erythema. No pallor. CARDIOVASCULAR: Normal heart rate noted, regular rhythm, no murmur. RESPIRATORY: Clear to auscultation bilaterally. Effort and breath sounds normal, no problems with respiration noted. BREASTS: Symmetric in size. No masses, skin changes, nipple drainage, or lymphadenopathy. ABDOMEN: Soft, normal bowel sounds, no distention noted.  No tenderness, rebound or guarding.  BLADDER: Normal PELVIC:  External Genitalia: Normal  BUS: Normal  Vagina: Normal  Cervix: Normal, white discharge   Uterus: Normal  Adnexa: Normal  RV: External Exam NormaI  MUSCULOSKELETAL: Normal range of motion. No tenderness.  No cyanosis, clubbing, or edema.  2+ distal pulses. LYMPHATIC: No Axillary, Supraclavicular, or Inguinal Adenopathy.    Assessment:   Annual gynecologic examination 31 y.o. Contraception: tubal ligation Normal BMI Problem List Items Addressed This Visit    None    Visit Diagnoses    Urinary frequency    -  Primary   Relevant Orders   POCT urinalysis dipstick (Completed)   Urine culture   Well woman exam with routine gynecological exam       Tobacco user       Status post tubal ligation         Hypothyroidism   Vitamin D deficiency   Plan:  Pap: Not needed Mammogram: Not Indicated Stool Guaiac Testing:  Not Indicated Labs: completed by PCP, STD labs and cultures today Routine preventative health maintenance measures emphasized: Exercise/Diet/Weight control, Tobacco Warnings and Safe Sex. Recommend Kegel exercises to improve bladder control and use of [place] balls that can be used to strengthen the pelvic floor muscles and tighten the vagina.  Encouraged diet increased in fiber and use of stool softer to improve bowel function. Will call with lab/culture results.  Return to Clinic - 1 Year for AE  Doreene BurkeAnnie Johngabriel Verde, CNM   Doreene BurkeAnnie Raziel Koenigs, CNM

## 2016-05-10 NOTE — Patient Instructions (Signed)
Preventive Care 18-39 Years, Female Preventive care refers to lifestyle choices and visits with your health care provider that can promote health and wellness. What does preventive care include?  A yearly physical exam. This is also called an annual well check.  Dental exams once or twice a year.  Routine eye exams. Ask your health care provider how often you should have your eyes checked.  Personal lifestyle choices, including:  Daily care of your teeth and gums.  Regular physical activity.  Eating a healthy diet.  Avoiding tobacco and drug use.  Limiting alcohol use.  Practicing safe sex.  Taking vitamin and mineral supplements as recommended by your health care provider. What happens during an annual well check? The services and screenings done by your health care provider during your annual well check will depend on your age, overall health, lifestyle risk factors, and family history of disease. Counseling  Your health care provider may ask you questions about your:  Alcohol use.  Tobacco use.  Drug use.  Emotional well-being.  Home and relationship well-being.  Sexual activity.  Eating habits.  Work and work environment.  Method of birth control.  Menstrual cycle.  Pregnancy history. Screening  You may have the following tests or measurements:  Height, weight, and BMI.  Diabetes screening. This is done by checking your blood sugar (glucose) after you have not eaten for a while (fasting).  Blood pressure.  Lipid and cholesterol levels. These may be checked every 5 years starting at age 20.  Skin check.  Hepatitis C blood test.  Hepatitis B blood test.  Sexually transmitted disease (STD) testing.  BRCA-related cancer screening. This may be done if you have a family history of breast, ovarian, tubal, or peritoneal cancers.  Pelvic exam and Pap test. This may be done every 3 years starting at age 21. Starting at age 30, this may be done every 5  years if you have a Pap test in combination with an HPV test. Discuss your test results, treatment options, and if necessary, the need for more tests with your health care provider. Vaccines  Your health care provider may recommend certain vaccines, such as:  Influenza vaccine. This is recommended every year.  Tetanus, diphtheria, and acellular pertussis (Tdap, Td) vaccine. You may need a Td booster every 10 years.  Varicella vaccine. You may need this if you have not been vaccinated.  HPV vaccine. If you are 26 or younger, you may need three doses over 6 months.  Measles, mumps, and rubella (MMR) vaccine. You may need at least one dose of MMR. You may also need a second dose.  Pneumococcal 13-valent conjugate (PCV13) vaccine. You may need this if you have certain conditions and were not previously vaccinated.  Pneumococcal polysaccharide (PPSV23) vaccine. You may need one or two doses if you smoke cigarettes or if you have certain conditions.  Meningococcal vaccine. One dose is recommended if you are age 19-21 years and a first-year college student living in a residence hall, or if you have one of several medical conditions. You may also need additional booster doses.  Hepatitis A vaccine. You may need this if you have certain conditions or if you travel or work in places where you may be exposed to hepatitis A.  Hepatitis B vaccine. You may need this if you have certain conditions or if you travel or work in places where you may be exposed to hepatitis B.  Haemophilus influenzae type b (Hib) vaccine. You may need this   if you have certain risk factors. Talk to your health care provider about which screenings and vaccines you need and how often you need them. This information is not intended to replace advice given to you by your health care provider. Make sure you discuss any questions you have with your health care provider. Document Released: 04/16/2001 Document Revised: 11/08/2015  Document Reviewed: 12/20/2014 Elsevier Interactive Patient Education  2017 [employer]. Kegel Exercises Kegel exercises help strengthen the muscles that support the rectum, vagina, small intestine, bladder, and uterus. Doing Kegel exercises can help:  Improve bladder and bowel control.  Improve sexual response.  Reduce problems and discomfort during pregnancy. Kegel exercises involve squeezing your pelvic floor muscles, which are the same muscles you squeeze when you try to stop the flow of urine. The exercises can be done while sitting, standing, or lying down, but it is best to vary your position. Phase 1 exercises 1. Squeeze your pelvic floor muscles tight. You should feel a tight lift in your rectal area. If you are a female, you should also feel a tightness in your vaginal area. Keep your stomach, buttocks, and legs relaxed. 2. Hold the muscles tight for up to 10 seconds. 3. Relax your muscles. Repeat this exercise 50 times a day or as many times as told by your health care provider. Continue to do this exercise for at least 4-6 weeks or for as long as told by your health care provider. This information is not intended to replace advice given to you by your health care provider. Make sure you discuss any questions you have with your health care provider. Document Released: 02/05/2012 Document Revised: 10/14/2015 Document Reviewed: 01/08/2015 Elsevier Interactive Patient Education  2017 [employer].

## 2016-05-11 LAB — HEPATITIS C ANTIBODY

## 2016-05-11 LAB — HSV(HERPES SIMPLEX VRS) I + II AB-IGG
HSV 1 GLYCOPROTEIN G AB, IGG: 41.5 {index} — AB (ref 0.00–0.90)
HSV 2 Glycoprotein G Ab, IgG: 4.95 index — ABNORMAL HIGH (ref 0.00–0.90)

## 2016-05-11 LAB — HIV ANTIBODY (ROUTINE TESTING W REFLEX): HIV Screen 4th Generation wRfx: NONREACTIVE

## 2016-05-11 LAB — HEPATITIS B SURFACE ANTIGEN: Hepatitis B Surface Ag: NEGATIVE

## 2016-05-11 LAB — RPR: RPR Ser Ql: NONREACTIVE

## 2016-05-12 LAB — URINE CULTURE: ORGANISM ID, BACTERIA: NO GROWTH

## 2016-05-13 ENCOUNTER — Encounter: Payer: Self-pay | Admitting: Certified Nurse Midwife

## 2016-05-15 ENCOUNTER — Other Ambulatory Visit: Payer: Self-pay | Admitting: Certified Nurse Midwife

## 2016-05-15 ENCOUNTER — Encounter: Payer: Self-pay | Admitting: Certified Nurse Midwife

## 2016-05-15 LAB — NUSWAB VAGINITIS PLUS (VG+)
ATOPOBIUM VAGINAE: HIGH {score} — AB
CANDIDA ALBICANS, NAA: NEGATIVE
Candida glabrata, NAA: NEGATIVE
Chlamydia trachomatis, NAA: NEGATIVE
NEISSERIA GONORRHOEAE, NAA: NEGATIVE
Trich vag by NAA: NEGATIVE

## 2016-05-15 MED ORDER — METRONIDAZOLE 500 MG PO TABS: 500.0000 mg | ORAL_TABLET | Freq: Two times a day (BID) | ORAL | 0 refills | Status: AC

## 2016-05-15 NOTE — Progress Notes (Signed)
Tamara BeechamCynthia NuSwab showed Indeterminate for BV. Flagyl prescription sent to her pharmacy on file. Pt notified via my chart of results and instructions for Flagyl reviewed.   Doreene BurkeAnnie Xaviera Walker, CNM

## 2016-07-11 ENCOUNTER — Other Ambulatory Visit: Payer: Self-pay | Admitting: Family Medicine

## 2016-07-11 DIAGNOSIS — E063 Autoimmune thyroiditis: Secondary | ICD-10-CM

## 2016-07-12 ENCOUNTER — Telehealth: Payer: Self-pay | Admitting: Family Medicine

## 2016-07-12 NOTE — Telephone Encounter (Signed)
Pt states she is out of her Levothyroxine. [facility] [address]

## 2016-07-12 NOTE — Telephone Encounter (Signed)
Covering for colleague Normal TSH March 2018; Rx approved

## 2016-07-12 NOTE — Telephone Encounter (Signed)
I refilled this just a little while ago; she can check with her pharmacy; thank you

## 2016-07-16 ENCOUNTER — Other Ambulatory Visit: Payer: Self-pay | Admitting: Family Medicine

## 2016-07-16 DIAGNOSIS — E063 Autoimmune thyroiditis: Secondary | ICD-10-CM

## 2016-07-16 NOTE — Telephone Encounter (Signed)
Medication has been refilled and sent back to [facility] [address] due to medication was never picked up and pharmacy sent back

## 2016-09-16 ENCOUNTER — Telehealth: Payer: Self-pay | Admitting: Cardiology

## 2016-09-16 NOTE — Telephone Encounter (Signed)
°  LMOV for patient to schedule echo see note below     Message   I would see if she would like to at least do the Echo and I could call her with results.     [place] Cellar[place]amela A.       ----- Message -----  From: Joline MaxcyHarkins, Jennifer B  Sent: 09/12/2016 10:19 AM  To: Bryna ColanderPamela S Allen, RN  Subject: RE: Patient never r/s will call today to rem*    She cancelled the echo too. Should I call her to r/s or remove from wq ?    ----- Message -----  From: Bryna ColanderAllen, Pamela S, RN  Sent: 09/12/2016  9:03 AM  To: Joline MaxcyJennifer B Harkins  Subject: RE: Patient never r/s will call today to rem*    So according to her last visit note Dr. Alvino ChapelIngal said if her pain resolves she could cancel follow up appointment after echocardiogram. Let me know if I can assist in anyway.      Thanks,  [place] A.     ----- Message -----  From: Joline MaxcyHarkins, Jennifer B  Sent: 09/12/2016  8:56 AM  To: Bryna ColanderPamela S Allen, RN, Joline MaxcyJennifer B Harkins  Subject: Patient never r/s will call today to remind *

## 2016-10-01 NOTE — Telephone Encounter (Signed)
2 nd attempt to schedule appt for echo.  lmov to call office

## 2016-10-04 ENCOUNTER — Telehealth: Payer: Self-pay | Admitting: Family Medicine

## 2016-10-04 DIAGNOSIS — E063 Autoimmune thyroiditis: Secondary | ICD-10-CM

## 2016-10-04 NOTE — Telephone Encounter (Signed)
PT NEEDS REFILL LEVOTHYROXINE. PHARM IS [facility] [address]. PT ONLY HAS A FEW LEFT.

## 2016-10-05 MED ORDER — LEVOTHYROXINE SODIUM 100 MCG PO TABS: 100.0000 ug | ORAL_TABLET | Freq: Every day | ORAL | 0 refills | Status: DC

## 2016-10-05 NOTE — Telephone Encounter (Signed)
[facility] [address]

## 2016-10-07 NOTE — Telephone Encounter (Signed)
LFT MESSAGE RX AT PHARM °

## 2016-10-16 NOTE — Telephone Encounter (Signed)
3 attempts to schedule echo.    Unable to contact.  Letter sent.

## 2016-10-25 ENCOUNTER — Encounter: Payer: Self-pay | Admitting: Family Medicine

## 2016-10-25 ENCOUNTER — Ambulatory Visit (INDEPENDENT_AMBULATORY_CARE_PROVIDER_SITE_OTHER): Payer: BLUE CROSS/BLUE SHIELD | Admitting: Family Medicine

## 2016-10-25 VITALS — BP 110/64 | HR 82 | Temp 98.1°F | Resp 16 | Ht 62.0 in | Wt 136.3 lb

## 2016-10-25 DIAGNOSIS — Z113 Encounter for screening for infections with a predominantly sexual mode of transmission: Secondary | ICD-10-CM

## 2016-10-25 DIAGNOSIS — Z7251 High risk heterosexual behavior: Secondary | ICD-10-CM

## 2016-10-25 NOTE — Addendum Note (Signed)
Addended by: Doren Custard on: 10/25/2016 08:54 AM   Modules accepted: Orders

## 2016-10-25 NOTE — Progress Notes (Addendum)
Name: Tamara Walker   MRN: [id number]    DOB: 07-13-1985   Date:10/25/2016       Progress Note  Subjective  Chief Complaint  Chief Complaint  Patient presents with  . Exposure to STD    would like to be tested for STD    HPI  Pt presents with concern for STI and would like screening. She has noted a few "bumps" on her female partner's genitalia and wants testing for herpes along with a full STI panel. She denies symptoms - no abdominal pain, dysuria, abnormal vaginal discharge or vaginal bleeding, rashes, fatigue.  She spoke with her partner and he stated that he had been tested over 6 months ago, he is also going to have STI testing done in the very near future.  They do not use condoms, she has had tubal ligation and does any other form of birth control.  Patient Active Problem List   Diagnosis Date Noted  . Well woman exam without gynecological exam 05/03/2016  . Chest pain made worse by breathing 01/03/2016  . Hashimoto's thyroiditis 10/31/2015  . Goiter 01/03/2015  . Thyroid nodule 01/03/2015    Social History  Substance Use Topics  . Smoking status: Current Every Day Smoker    Packs/day: 0.25    Types: Cigarettes  . Smokeless tobacco: Never Used  . Alcohol use 0.0 oz/week     Comment: occasional 1-2x/yr    Current Outpatient Prescriptions:  .  levothyroxine (SYNTHROID, LEVOTHROID) 100 MCG tablet, Take 1 tablet (100 mcg total) by mouth daily., Disp: 90 tablet, Rfl: 0 .  Vitamin D, Ergocalciferol, (DRISDOL) 50000 units CAPS capsule, Take 1 capsule (50,000 Units total) by mouth every 7 (seven) days. (Patient not taking: Reported on 10/25/2016), Disp: 12 capsule, Rfl: 0  No Known Allergies  ROS  Constitutional: Negative for fever or weight change.  Respiratory: Negative for cough and shortness of breath.   Cardiovascular: Negative for chest pain or palpitations.  Gastrointestinal: Negative for abdominal pain, no bowel changes.  Musculoskeletal: Negative for gait problem  or joint swelling.  Skin: Negative for rash.  Neurological: Negative for dizziness or headache.  No other specific complaints in a complete review of systems (except as listed in HPI above).  Objective  Vitals:   10/25/16 0838  BP: 110/64  Pulse: 82  Resp: 16  Temp: 98.1 F (36.7 C)  TempSrc: Oral  SpO2: 98%  Weight: 136 lb 4.8 oz (61.8 kg)  Height: 5\' 2"  (1.575 m)   Body mass index is 24.93 kg/m.  Nursing Note and Vital Signs reviewed.  Physical Exam  Constitutional: Patient appears well-developed and well-nourished. No distress.  HEENT: head atraumatic, normocephalic Cardiovascular: Normal rate, regular rhythm, S1/S2 present.  No murmur or rub heard. No BLE edema. Pulmonary/Chest: Effort normal and breath sounds clear. No respiratory distress or retractions. Abdominal: Soft and non-tender, bowel sounds present x4 quadrants. Psychiatric: Patient has a normal mood and affect. behavior is normal. Judgment and thought content normal.  No results found for this or any previous visit (from the past 2160 hour(s)).   Assessment & Plan  1. High risk sexual behavior - HSV 1/2 Ab (IgM), IFA w/rflx Titer - GC/Chlamydia Probe Amp - HIV antibody - RPR - SureSwab, T.vaginalis RNA,Ql,Female  2. Screen for STD (sexually transmitted disease) - HSV 1/2 Ab (IgM), IFA w/rflx Titer - GC/Chlamydia Probe Amp - HIV antibody - RPR - SureSwab, T.vaginalis RNA,Ql,Female  Advised to use condoms always, and to abstain from any sexual  intercourse until results are returned.  I have reviewed this encounter including the documentation in this note and/or discussed this patient with the Deboraha Sprang, FNP, NP-C. I am certifying that I agree with the content of this note as supervising physician.  Alba Cory, MD [facility] 10/25/2016, 2:25 PM

## 2016-10-25 NOTE — Patient Instructions (Addendum)
Please abstain from sexual intercourse until you have received your results.  Preventing Sexually Transmitted Infections, Adult Sexually transmitted infections (STIs) are diseases that are passed (transmitted) from person to person through bodily fluids exchanged during sex or sexual contact. Bodily fluids include saliva, semen, blood, vaginal mucus, and urine. You may have an increased risk for developing an STI if you have unprotected oral, vaginal, or anal sex. Some common STIs include:  Herpes.  Hepatitis B.  Chlamydia.  Gonorrhea.  Syphilis.  HPV (human papillomavirus).  HIV (humanimmunodeficiency virus), the virus that can cause AIDS (acquired immunodeficiency virus).  How can I protect myself from sexually transmitted infections? The only way to completely prevent STIs is not to have sex of any kind (practice abstinence). This includes oral, vaginal, or anal sex. If you are sexually active, take these actions to lower your risk of getting an STI:  Have only one sex partner (be monogamous) or limit the number of sexual partners you have.  Stay up-to-date on immunizations. Certain vaccines can lower your risk of getting certain STIs, such as: ? Hepatitis A and B vaccines. You may have been vaccinated as a young child, but likely need a booster shot as a teen or young adult. ? HPV vaccine. This vaccine is recommended if you are a man under age 49 or a woman under age 25.  Use methods that prevent the exchange of body fluids between partners (barrier protection) every time you have sex. Barrier protection can be used during oral, vaginal, or anal sex. Commonly used barrier methods include: ? Female condom. ? Female condom. ? Dental dam.  Get tested regularly for STIs. Have your sexual partner get tested regularly as well.  Avoid mixing alcohol, drugs, and sex. Alcohol and drug use can affect your ability to make good decisions and can lead to risky sexual behaviors.  Ask your  health care provider about taking pre-exposure prophylaxis (PrEP) to prevent HIV infection if you: ? Have a HIV-positive sexual partner. ? Have multiple sexual partners or partners who do not know their HIV status, and do not regularly use a condom during sex. ? Use injection drugs and share needles.  Birth control pills, injections, implants, and intrauterine devices (IUDs) do not protect against STIs. To prevent both STIs and pregnancy, always use a condom with another form of birth control. Some STIs, such as herpes, are spread through skin to skin contact. A condom does not protect you from getting such STIs. If you or your partner have herpes and there is an active flare with open sores, avoid all sexual contact. Why are these changes important? Taking steps to practice safe sex protects you and others. Many STIs can be cured. However, some STIs are not curable and will affect you for the rest of your life. STIs can be passed on to another person even if you do not have symptoms. What can happen if changes are not made? Certain STIs may:  Require you to take medicine for the rest of your life.  Affect your ability to have children (your fertility).  Increase your risk for developing another STI or certain serious health conditions, such as: ? Cervical cancer. ? Head and neck cancer. ? Pelvic inflammatory disease (PID) in women. ? Organ damage or damage to other parts of your body, if the infection spreads.  Be passed to a baby during childbirth.  How are sexually transmitted infections treated? If you or your partner know or think that you may have  an STI:  Talk with your healthcare provider about what can be done to treat it. Some STIs can be treated and cured with medicines.  For curable STIs, you and your partner should avoid sex during treatment and for several days after treatment is complete.  You and your partner should both be treated at the same time, if there is any  chance that your partner is infected as well. If you get treatment but your partner does not, your partner can re-infect you when you resume sexual contact.  Do not have unprotected sex.  Where to find more information: Learn more about sexually transmitted diseases and infections from:  Centers for Disease Control and Prevention: ? More information about specific STIs: SolutionApps.co.za ? Find places to get sexual health counseling and treatment for free or for a low cost: gettested.TonerPromos.no  U.S. Department of Health and Human Services: NotebookPreviews.si.html  Summary  The only way to completely prevent STIs is not to have sex (practice abstinence), including oral, vaginal, or anal sex.  STIs can spread through saliva, semen, blood, vaginal mucus, urine, or sexual contact.  If you do have sex, limit your number of sexual partners and use a barrier protection method every time you have sex.  If you develop an STI, get treated right away and ask your partner to be treated as well. Do not resume having sex until both of you have completed treatment for the STI. This information is not intended to replace advice given to you by your health care provider. Make sure you discuss any questions you have with your health care provider. Document Released: 02/15/2016 Document Revised: 02/15/2016 Document Reviewed: 02/15/2016 Elsevier Interactive Patient Education  [employer].

## 2016-10-26 LAB — RPR

## 2016-10-26 LAB — GC/CHLAMYDIA PROBE AMP
CT PROBE, AMP APTIMA: NOT DETECTED
GC PROBE AMP APTIMA: NOT DETECTED

## 2016-10-26 LAB — HIV ANTIBODY (ROUTINE TESTING W REFLEX): HIV: NONREACTIVE

## 2016-10-28 LAB — SURESWAB, T.VAGINALIS RNA,QL,FEMALE: TRICHOMONAS VAGINALIS RNA: NOT DETECTED

## 2016-10-28 LAB — HSV 1/2 AB (IGM), IFA W/RFLX TITER
HSV 1 IGM SCREEN: NEGATIVE
HSV 2 IGM SCREEN: NEGATIVE

## 2016-11-08 ENCOUNTER — Ambulatory Visit (INDEPENDENT_AMBULATORY_CARE_PROVIDER_SITE_OTHER): Payer: BLUE CROSS/BLUE SHIELD | Admitting: Family Medicine

## 2016-11-08 ENCOUNTER — Encounter: Payer: Self-pay | Admitting: Family Medicine

## 2016-11-08 VITALS — BP 112/68 | HR 92 | Temp 97.7°F | Resp 16 | Ht 62.0 in | Wt 135.2 lb

## 2016-11-08 DIAGNOSIS — E559 Vitamin D deficiency, unspecified: Secondary | ICD-10-CM | POA: Diagnosis not present

## 2016-11-08 DIAGNOSIS — Z23 Encounter for immunization: Secondary | ICD-10-CM | POA: Diagnosis not present

## 2016-11-08 DIAGNOSIS — E079 Disorder of thyroid, unspecified: Secondary | ICD-10-CM | POA: Diagnosis not present

## 2016-11-08 DIAGNOSIS — R5383 Other fatigue: Secondary | ICD-10-CM

## 2016-11-08 DIAGNOSIS — G4700 Insomnia, unspecified: Secondary | ICD-10-CM

## 2016-11-08 MED ORDER — ESZOPICLONE 1 MG PO TABS: 1.0000 mg | ORAL_TABLET | Freq: Every evening | ORAL | 0 refills | Status: DC | PRN

## 2016-11-08 NOTE — Progress Notes (Signed)
Name: Tamara Walker   MRN: [id number]    DOB: 08-01-1985   Date:11/08/2016       Progress Note  Subjective  Chief Complaint  Chief Complaint  Patient presents with  . Follow-up    6 mo    Thyroid Problem  Presents for follow-up visit. Symptoms include cold intolerance, depressed mood, dry skin and fatigue. Patient reports no constipation.  Insomnia  Primary symptoms: difficulty falling asleep, frequent awakening.  The onset quality is gradual. The symptoms are aggravated by anxiety. Types of beverages you drink: energy drink. Past treatments include medication (Tylenol PM). Typical bedtime:  10-11 P.M..  How long after going to bed to you fall asleep: over an hour.        Past Medical History:  Diagnosis Date  . Thyroid disease   . Vitamin D deficiency     Past Surgical History:  Procedure Laterality Date  . CESAREAN SECTION    . EYE SURGERY    . TUBAL LIGATION  2016    Family History  Problem Relation Age of Onset  . Dementia Father   . Diabetes Maternal Grandfather   . Diabetes Paternal Grandmother   . Cancer Neg Hx   . Heart disease Neg Hx     Social History   Social History  . Marital status: Married    Spouse name: N/A  . Number of children: N/A  . Years of education: N/A   Occupational History  . Not on file.   Social History Main Topics  . Smoking status: Current Every Day Smoker    Packs/day: 0.25    Types: Cigarettes  . Smokeless tobacco: Never Used  . Alcohol use 0.0 oz/week     Comment: occasional 1-2x/yr  . Drug use: No  . Sexual activity: Yes    Partners: Male    Birth control/ protection: Surgical   Other Topics Concern  . Not on file   Social History Narrative  . No narrative on file     Current Outpatient Prescriptions:  .  levothyroxine (SYNTHROID, LEVOTHROID) 100 MCG tablet, Take 1 tablet (100 mcg total) by mouth daily., Disp: 90 tablet, Rfl: 0  No Known Allergies   Review of Systems  Constitutional: Positive for  fatigue.  Gastrointestinal: Negative for constipation.  Endo/Heme/Allergies: Positive for cold intolerance.  Psychiatric/Behavioral: The patient has insomnia.       Objective  Vitals:   11/08/16 0818  BP: 112/68  Pulse: 92  Resp: 16  Temp: 97.7 F (36.5 C)  TempSrc: Oral  SpO2: 99%  Weight: 135 lb 3.2 oz (61.3 kg)  Height:  (1.575 m)    Physical Exam  Constitutional: She is oriented to person, place, and time and well-developed, well-nourished, and in no distress.  HENT:  Head: Normocephalic and atraumatic.  Cardiovascular: Normal rate, regular rhythm and normal heart sounds.   No murmur heard. Pulmonary/Chest: Effort normal and breath sounds normal. She has no wheezes.  Abdominal: Soft. Bowel sounds are normal. There is no tenderness.  Neurological: She is alert and oriented to person, place, and time.  Psychiatric: Mood, memory, affect and judgment normal.  Nursing note and vitals reviewed.    Assessment & Plan  1. Vitamin D deficiency Patient has finished taking 12 weeks of vitamin D, recheck levels - VITAMIN D 25 Hydroxy (Vit-D Deficiency, Fractures)  2. Thyroid disease Obtain TSH and adjust Synthroid as appropriate - TSH  3. Insomnia, unspecified type Started on Lunesta 1 mg at bedtime for insomnia,  follow-up in one month - eszopiclone (LUNESTA) 1 MG TABS tablet; Take 1 tablet (1 mg total) by mouth at bedtime as needed for sleep. Take immediately before bedtime  Dispense: 30 tablet; Refill: 0  4. Fatigue, unspecified type  - CBC with Differential/Platelet  5. Need for influenza vaccination  - Flu Vaccine QUAD 6+ mos PF IM (Fluarix Quad PF)  Janya Eveland Asad A. Faylene KurtzShah [facility] 11/08/2016 8:31 AM

## 2016-11-11 LAB — CBC WITH DIFFERENTIAL/PLATELET
BASOS ABS: 26 {cells}/uL (ref 0–200)
Basophils Relative: 0.3 %
Eosinophils Absolute: 53 cells/uL (ref 15–500)
Eosinophils Relative: 0.6 %
HCT: 39.8 % (ref 35.0–45.0)
Hemoglobin: 12.3 g/dL (ref 11.7–15.5)
LYMPHS ABS: 2702 {cells}/uL (ref 850–3900)
MCH: 22.5 pg — ABNORMAL LOW (ref 27.0–33.0)
MCHC: 30.9 g/dL — ABNORMAL LOW (ref 32.0–36.0)
MCV: 72.9 fL — AB (ref 80.0–100.0)
MPV: 9.7 fL (ref 7.5–12.5)
Monocytes Relative: 5.7 %
NEUTROS ABS: 5518 {cells}/uL (ref 1500–7800)
NEUTROS PCT: 62.7 %
PLATELETS: 299 10*3/uL (ref 140–400)
RBC: 5.46 10*6/uL — AB (ref 3.80–5.10)
RDW: 13.3 % (ref 11.0–15.0)
TOTAL LYMPHOCYTE: 30.7 %
WBC mixed population: 502 cells/uL (ref 200–950)
WBC: 8.8 10*3/uL (ref 3.8–10.8)

## 2016-11-11 LAB — TEST AUTHORIZATION

## 2016-11-11 LAB — VITAMIN D 25 HYDROXY (VIT D DEFICIENCY, FRACTURES): Vit D, 25-Hydroxy: 23 ng/mL — ABNORMAL LOW (ref 30–100)

## 2016-11-11 LAB — TSH: TSH: 0.5 m[IU]/L

## 2016-11-13 ENCOUNTER — Telehealth: Payer: Self-pay

## 2016-11-13 ENCOUNTER — Encounter: Payer: Self-pay | Admitting: Family Medicine

## 2016-11-13 MED ORDER — VITAMIN D (ERGOCALCIFEROL) 1.25 MG (50000 UNIT) PO CAPS: 50000.0000 [IU] | ORAL_CAPSULE | ORAL | 0 refills | Status: DC

## 2016-11-13 NOTE — Telephone Encounter (Signed)
Patient has been notified of lab results and a prescription for vitamin D3 50,000 units take 1 capsule once a week x12 weeks has been sent to [facility] [address] per Dr. Sherryll BurgerShah, patient verbalized understanding

## 2016-12-06 ENCOUNTER — Ambulatory Visit: Payer: BLUE CROSS/BLUE SHIELD | Admitting: Family Medicine

## 2016-12-24 ENCOUNTER — Ambulatory Visit (INDEPENDENT_AMBULATORY_CARE_PROVIDER_SITE_OTHER): Payer: BLUE CROSS/BLUE SHIELD | Admitting: Family Medicine

## 2016-12-24 ENCOUNTER — Encounter: Payer: Self-pay | Admitting: Family Medicine

## 2016-12-24 VITALS — BP 118/72 | HR 92 | Temp 98.1°F | Resp 16 | Ht 60.0 in | Wt 142.2 lb

## 2016-12-24 DIAGNOSIS — L2089 Other atopic dermatitis: Secondary | ICD-10-CM | POA: Diagnosis not present

## 2016-12-24 DIAGNOSIS — L301 Dyshidrosis [pompholyx]: Secondary | ICD-10-CM

## 2016-12-24 MED ORDER — CRISABOROLE 2 % EX OINT: 1.0000 "application " | TOPICAL_OINTMENT | Freq: Two times a day (BID) | CUTANEOUS | 0 refills | Status: DC

## 2016-12-24 MED ORDER — TRIAMCINOLONE ACETONIDE 0.05 % EX OINT: 1.0000 "application " | TOPICAL_OINTMENT | Freq: Two times a day (BID) | CUTANEOUS | 0 refills | Status: DC

## 2016-12-24 NOTE — Patient Instructions (Addendum)

## 2016-12-24 NOTE — Progress Notes (Signed)
Name: Tamara Walker   MRN: [id number]    DOB: Jul 28, 1985   Date:12/24/2016       Progress Note  Subjective  Chief Complaint  Chief Complaint  Patient presents with  . Rash    on finger for 2 months, dry skin, scaly, itching, burning    HPI  Patient presents with dry scaling, erythematous patch to the RIGHT lateral ring finger progressively worsening over the last 2 months. She had allergic dry rash as a child, but has not had this in quite some time. She notes that it is irritated and dry, cracking in areas with small amounts of bleeding.  No purulent drainage and no red streaking; no fevers/chills.  Irritants include washing dishes, soaking fingernails in acetone, and coming into contact with Darene LamerNair.  Patient Active Problem List   Diagnosis Date Noted  . Vitamin D deficiency 11/08/2016  . Well woman exam without gynecological exam 05/03/2016  . Chest pain made worse by breathing 01/03/2016  . Hashimoto's thyroiditis 10/31/2015  . Thyroid disease 01/03/2015  . Thyroid nodule 01/03/2015    Social History  Substance Use Topics  . Smoking status: Current Every Day Smoker    Packs/day: 0.25    Types: Cigarettes  . Smokeless tobacco: Never Used  . Alcohol use 0.0 oz/week     Comment: occasional 1-2x/yr     Current Outpatient Prescriptions:  .  levothyroxine (SYNTHROID, LEVOTHROID) 100 MCG tablet, Take 1 tablet (100 mcg total) by mouth daily., Disp: 90 tablet, Rfl: 0 .  Vitamin D, Ergocalciferol, (DRISDOL) 50000 units CAPS capsule, Take 1 capsule (50,000 Units total) by mouth once a week. For 12 weeks, Disp: 12 capsule, Rfl: 0 .  eszopiclone (LUNESTA) 1 MG TABS tablet, Take 1 tablet (1 mg total) by mouth at bedtime as needed for sleep. Take immediately before bedtime, Disp: 30 tablet, Rfl: 0  No Known Allergies  ROS Constitutional: Negative for fever or weight change.  Respiratory: Negative for cough and shortness of breath.   Cardiovascular: Negative for chest pain or  palpitations.  Gastrointestinal: Negative for abdominal pain, no bowel changes.  Musculoskeletal: Negative for gait problem or joint swelling.  Skin: See HPI. Neurological: Negative for dizziness or headache.  No other specific complaints in a complete review of systems (except as listed in HPI above).  Objective  Vitals:   12/24/16 1032  BP: 118/72  Pulse: 92  Resp: 16  Temp: 98.1 F (36.7 C)  TempSrc: Oral  SpO2: 95%  Weight: 142 lb 3.2 oz (64.5 kg)  Height: 5' (1.524 m)   Body mass index is 27.77 kg/m.  Nursing Note and Vital Signs reviewed.  Physical Exam  Constitutional: Patient appears well-developed and well-nourished. Obese No distress.  HEENT: head atraumatic, normocephalic Cardiovascular: Normal rate, regular rhythm, S1/S2 present.  No murmur or rub heard. No BLE edema. Pulmonary/Chest: Effort normal and breath sounds clear. No respiratory distress or retractions. Psychiatric: Patient has a normal mood and affect. behavior is normal. Judgment and thought content normal. Skin: RIGHT lateral ring finger has dry scaling, mildly erythematous patch with small cracks present.  Area is mildly tender to palpation.  No bleeding or drainage, no dried exudate.  No red streaking. MSK: Adequate and equal grip strength bilaterally, cap refill <3 second in BUE.  Pt with full hand and wrist AROM bilaterally.  No joint swelling or effusion. No bony tenderness.  Recent Results (from the past 2160 hour(s))  GC/Chlamydia Probe Amp     Status: None  Collection Time: 10/25/16  8:47 AM  Result Value Ref Range   CT Probe RNA NOT DETECTED     Comment:                    **Normal Reference Range: NOT DETECTED**   This test was performed using the APTIMA COMBO2 Assay ([employer]   The analytical performance characteristics of this assay, when used to test SurePath specimens have been determined by [facility]      GC Probe RNA NOT DETECTED     Comment:                     **Normal Reference Range: NOT DETECTED**   This test was performed using the APTIMA COMBO2 Assay ([employer]   The analytical performance characteristics of this assay, when used to test SurePath specimens have been determined by [employer], T.vaginalis RNA,Ql,Female     Status: None   Collection Time: 10/25/16  8:54 AM  Result Value Ref Range   Trichomonas vaginalis RNA Not Detected Not Detected    Comment:   This test was performed using the APTIMA(R) Trichomonas vaginalis Assay (GEN-PROBE(R)).   For more information on this test, go to: http://education.[facility].com/faq/Trichomonastma   HSV 1/2 Ab (IgM), IFA w/rflx Titer     Status: None   Collection Time: 10/25/16  9:06 AM  Result Value Ref Range   HSV 1 IgM Screen NEGATIVE    HSV 2 IgM Screen NEGATIVE     Comment: REFERENCE RANGE: NEGATIVE   The IFA procedure for measuring IgM antibodies to HSV 1 and HSV 2 detects both type-common and type- specific HSV antibodies. Thus, IgM reactivity to both HSV 1 and HSV 2 may represent crossreactive HSV antibodies rather than exposure to both HSV 1 and HSV 2.   This test was developed and its analytical performance characteristics have been determined by [facility]. It has not been cleared or approved by FDA. This assay has been validated pursuant to the CLIA regulations and is used for clinical purposes.   HIV antibody     Status: None   Collection Time: 10/25/16  9:06 AM  Result Value Ref Range   HIV 1&2 Ab, 4th Generation NONREACTIVE NONREACTIVE    Comment:   HIV-1 antigen and HIV-1/HIV-2 antibodies were not detected.  There is no laboratory evidence of HIV infection.   HIV-1/2 Antibody Diff        Not indicated. HIV-1 RNA, Qual TMA          Not indicated.     PLEASE NOTE: This information has been disclosed to you from records whose confidentiality may be protected by state law. If your state requires such  protection, then the state law prohibits you from making any further disclosure of the information without the specific written consent of the person to whom it pertains, or as otherwise permitted by law. A general authorization for the release of medical or other information is NOT sufficient for this purpose.   The performance of this assay has not been clinically validated in patients less than 27 years old.   For additional information please refer to http://education.[facility].com/faq/FAQ106.  (This link is being provided for informational/educational purposes only.)     RPR     Status: None   Collection Time: 10/25/16  9:06 AM  Result Value Ref Range   RPR Ser Ql NON REAC NON REAC  CBC with Differential/Platelet  Status: Abnormal   Collection Time: 11/08/16  8:50 AM  Result Value Ref Range   WBC 8.8 3.8 - 10.8 Thousand/uL   RBC 5.46 (H) 3.80 - 5.10 Million/uL   Hemoglobin 12.3 11.7 - 15.5 g/dL   HCT 84.6 96.2 - 95.2 %   MCV 72.9 (L) 80.0 - 100.0 fL   MCH 22.5 (L) 27.0 - 33.0 pg   MCHC 30.9 (L) 32.0 - 36.0 g/dL   RDW 84.1 32.4 - 40.1 %   Platelets 299 140 - 400 Thousand/uL   MPV 9.7 7.5 - 12.5 fL   Neutro Abs 5,518 1,500 - 7,800 cells/uL   Lymphs Abs 2,702 850 - 3,900 cells/uL   WBC mixed population 502 200 - 950 cells/uL   Eosinophils Absolute 53 15 - 500 cells/uL   Basophils Absolute 26 0 - 200 cells/uL   Neutrophils Relative % 62.7 %   Total Lymphocyte 30.7 %   Monocytes Relative 5.7 %   Eosinophils Relative 0.6 %   Basophils Relative 0.3 %  VITAMIN D 25 Hydroxy (Vit-D Deficiency, Fractures)     Status: Abnormal   Collection Time: 11/08/16  8:50 AM  Result Value Ref Range   Vit D, 25-Hydroxy 23 (L) 30 - 100 ng/mL    Comment: Vitamin D Status         25-OH Vitamin D: . Deficiency:                    <20 ng/mL Insufficiency:             20 - 29 ng/mL Optimal:                 > or = 30 ng/mL . For 25-OH Vitamin D testing on patients on   D2-supplementation and patients for whom quantitation  of D2 and D3 fractions is required, the QuestAssureD(TM) 25-OH VIT D, (D2,D3), LC/MS/MS is recommended: order  code 02725 (patients >96yrs). . For more information on this test, go to: http://education.[facility].com/faq/FAQ163 (This link is being provided for  informational/educational purposes only.)   TSH     Status: None   Collection Time: 11/08/16  8:50 AM  Result Value Ref Range   TSH 0.50 mIU/L    Comment:           Reference Range .           > or = 20 Years  0.40-4.50 .                Pregnancy Ranges           First trimester    0.26-2.66           Second trimester   0.55-2.73           Third trimester    0.43-2.91   TEST AUTHORIZATION     Status: None   Collection Time: 11/08/16  8:50 AM  Result Value Ref Range   TEST NAME: TSH    TEST CODE: [id number]    CLIENT CONTACT: ANGELA DICKENS    REPORT ALWAYS MESSAGE SIGNATURE      Comment: . The laboratory testing on this patient was verbally requested or confirmed by the ordering physician or his or her authorized representative after contact with an employee of [employer]. Federal regulations require that we maintain on file written authorization for all laboratory testing.  Accordingly we are asking that the ordering physician or his or her authorized representative sign a copy of this report and  promptly return it to the client service representative. . . Signature:____________________________________________________ . Please fax this signed page to [phone] or return it via your [employer] courier.      Assessment & Plan  1. Other atopic dermatitis - Crisaborole (EUCRISA) 2 % OINT; Apply 1 application topically 2 (two) times daily.  Dispense: 60 g; Refill: 0 - TRIAMCINOLONE ACETONIDE, TOP, 0.05 % OINT; Apply 1 application topically 2 (two) times daily. Only use on skin that is not broken/open.  Dispense: 17 g; Refill: 0 -  Advised to use Eucrisa until skin no longer has cracking present, then she may use both Eucrisa and Triamcinolone. Strongly advised to avoid irritants that are listed in HPI.  Also advised to use vaseline or other thick petroleum-based ointment as protective barrier while doing dishes or coming into contact with other irritants. -Red flags and when to present for emergency care or RTC including fever >101.14F, chest pain, shortness of breath, new/worsening/un-resolving symptoms, red streaking, pus/purulent drainage reviewed with patient at time of visit. Follow up and care instructions discussed and provided in AVS.

## 2016-12-26 MED ORDER — TRIAMCINOLONE ACETONIDE 0.5 % EX CREA: 1.0000 "application " | TOPICAL_CREAM | Freq: Three times a day (TID) | CUTANEOUS | 0 refills | Status: DC

## 2017-01-10 ENCOUNTER — Other Ambulatory Visit: Payer: Self-pay

## 2017-01-10 ENCOUNTER — Other Ambulatory Visit: Payer: Self-pay | Admitting: Family Medicine

## 2017-01-10 DIAGNOSIS — E063 Autoimmune thyroiditis: Secondary | ICD-10-CM

## 2017-01-10 MED ORDER — LEVOTHYROXINE SODIUM 100 MCG PO TABS: 100.0000 ug | ORAL_TABLET | Freq: Every day | ORAL | 0 refills | Status: DC

## 2017-01-10 NOTE — Telephone Encounter (Signed)
Last TSH labs done 11/2016; normal. Pt last seen on 11/08/2016. Pt suppose to follow-up with Dr. Sherryll BurgerShah in a month; no apt was set up.

## 2017-01-10 NOTE — Telephone Encounter (Signed)
[facility]

## 2017-01-10 NOTE — Progress Notes (Signed)
Sent to the right pharmacy and canceled the order that went to [facility].

## 2017-01-10 NOTE — Telephone Encounter (Signed)
Requesting refill on levothyroxin. Please send to [facility] [address].

## 2017-01-10 NOTE — Telephone Encounter (Signed)
Canceled the Rx to [facility] and sent to [address].

## 2017-03-17 ENCOUNTER — Ambulatory Visit (INDEPENDENT_AMBULATORY_CARE_PROVIDER_SITE_OTHER): Payer: BLUE CROSS/BLUE SHIELD | Admitting: Family Medicine

## 2017-03-17 ENCOUNTER — Encounter: Payer: Self-pay | Admitting: Family Medicine

## 2017-03-17 VITALS — BP 108/82 | Temp 98.1°F | Resp 16 | Wt 125.8 lb

## 2017-03-17 DIAGNOSIS — E063 Autoimmune thyroiditis: Secondary | ICD-10-CM

## 2017-03-17 DIAGNOSIS — K6289 Other specified diseases of anus and rectum: Secondary | ICD-10-CM | POA: Diagnosis not present

## 2017-03-17 DIAGNOSIS — R109 Unspecified abdominal pain: Secondary | ICD-10-CM | POA: Diagnosis not present

## 2017-03-17 DIAGNOSIS — K5900 Constipation, unspecified: Secondary | ICD-10-CM | POA: Diagnosis not present

## 2017-03-17 DIAGNOSIS — E559 Vitamin D deficiency, unspecified: Secondary | ICD-10-CM

## 2017-03-17 DIAGNOSIS — R634 Abnormal weight loss: Secondary | ICD-10-CM

## 2017-03-17 MED ORDER — HYOSCYAMINE SULFATE 0.125 MG PO TABS: 0.1250 mg | ORAL_TABLET | Freq: Four times a day (QID) | ORAL | 0 refills | Status: DC | PRN

## 2017-03-17 MED ORDER — HYDROCORTISONE 2.5 % RE CREA: 1.0000 "application " | TOPICAL_CREAM | Freq: Two times a day (BID) | RECTAL | 0 refills | Status: DC

## 2017-03-17 NOTE — Assessment & Plan Note (Addendum)
Check TSH; adjust medicine if needed 

## 2017-03-17 NOTE — Progress Notes (Signed)
BP 108/82   Temp 98.1 F (36.7 C)   Resp 16   Wt 125 lb 12.8 oz (57.1 kg)   LMP 03/04/2017   SpO2 95%   BMI 24.57 kg/m    Subjective:    Patient ID: Tamara Walker, female    DOB: 1986-01-04, 32 y.o.   MRN: [id number]  HPI: Tamara Walker is a 32 y.o. female  Chief Complaint  Patient presents with  . Hemorrhoids    HPI Sx started on Friday, all weekend; irritating; no blood at all Can feel something like a tight balloon No blood in the stool No straining Has BM maybe just twice a week, same for the whole week; mostly rice and meat; tries to drink water, avoid cheese Just lost weight Has thyroid issues; lost weight suddenly over the last two months Lab Results  Component Value Date   TSH 0.50 11/08/2016     Depression screen [facility] 2/9 03/17/2017 11/08/2016 05/03/2016 01/03/2016 10/31/2015  Decreased Interest 0 0 0 0 0  Down, Depressed, Hopeless 0 0 0 0 0  PHQ - 2 Score 0 0 0 0 0    Relevant past medical, surgical, family and social history reviewed Past Medical History:  Diagnosis Date  . Thyroid disease   . Vitamin D deficiency    Past Surgical History:  Procedure Laterality Date  . CESAREAN SECTION    . EYE SURGERY    . TUBAL LIGATION  2016   Family History  Problem Relation Age of Onset  . Dementia Father   . Diabetes Maternal Grandfather   . Diabetes Paternal Grandmother   . Cancer Neg Hx   . Heart disease Neg Hx    Social History   Tobacco Use  . Smoking status: Current Every Day Smoker    Packs/day: 0.25    Types: Cigarettes  . Smokeless tobacco: Never Used  Substance Use Topics  . Alcohol use: Yes    Alcohol/week: 0.0 oz    Comment: occasional 1-2x/yr  . Drug use: No    Interim medical history since last visit reviewed. Allergies and medications reviewed  Review of Systems Per HPI unless specifically indicated above     Objective:    BP 108/82   Temp 98.1 F (36.7 C)   Resp 16   Wt 125 lb 12.8 oz (57.1 kg)   LMP 03/04/2017   SpO2  95%   BMI 24.57 kg/m   Wt Readings from Last 3 Encounters:  03/17/17 125 lb 12.8 oz (57.1 kg)  12/24/16 142 lb 3.2 oz (64.5 kg)  11/08/16 135 lb 3.2 oz (61.3 kg)    Physical Exam  Constitutional: She appears well-developed and well-nourished.  Weight loss noted  HENT:  Mouth/Throat: Mucous membranes are normal.  Eyes: EOM are normal. No scleral icterus.  Cardiovascular: Normal rate and regular rhythm.  Pulmonary/Chest: Effort normal and breath sounds normal.  Abdominal: Soft. Normal appearance and bowel sounds are normal. She exhibits no distension. There is no tenderness.  Genitourinary:     Genitourinary Comments: Erythematous elongated growth at anus, perineal edge; no bleeding; not fimbriated, not verrucous  Skin: She is not diaphoretic. No pallor.  Psychiatric: She has a normal mood and affect. Her behavior is normal.    Results for orders placed or performed in visit on 11/08/16  CBC with Differential/Platelet  Result Value Ref Range   WBC 8.8 3.8 - 10.8 Thousand/uL   RBC 5.46 (H) 3.80 - 5.10 Million/uL   Hemoglobin 12.3 11.7 -  15.5 g/dL   HCT 16.139.8 09.635.0 - 04.545.0 %   MCV 72.9 (L) 80.0 - 100.0 fL   MCH 22.5 (L) 27.0 - 33.0 pg   MCHC 30.9 (L) 32.0 - 36.0 g/dL   RDW 40.913.3 81.111.0 - 91.415.0 %   Platelets 299 140 - 400 Thousand/uL   MPV 9.7 7.5 - 12.5 fL   Neutro Abs 5,518 1,500 - 7,800 cells/uL   Lymphs Abs 2,702 850 - 3,900 cells/uL   WBC mixed population 502 200 - 950 cells/uL   Eosinophils Absolute 53 15 - 500 cells/uL   Basophils Absolute 26 0 - 200 cells/uL   Neutrophils Relative % 62.7 %   Total Lymphocyte 30.7 %   Monocytes Relative 5.7 %   Eosinophils Relative 0.6 %   Basophils Relative 0.3 %  VITAMIN D 25 Hydroxy (Vit-D Deficiency, Fractures)  Result Value Ref Range   Vit D, 25-Hydroxy 23 (L) 30 - 100 ng/mL  TSH  Result Value Ref Range   TSH 0.50 mIU/L  TEST AUTHORIZATION  Result Value Ref Range   TEST NAME: TSH    TEST CODE: [id number]    CLIENT CONTACT:  ANGELA DICKENS    REPORT ALWAYS MESSAGE SIGNATURE    MD note: last labs with other provider show microcytosis     Assessment & Plan:   Problem List Items Addressed This Visit      Endocrine   Hashimoto's thyroiditis    Check TSH; adjust medicine if needed      Relevant Orders   TSH     Other   Vitamin D deficiency    Check vit D level      Relevant Orders   VITAMIN D 25 Hydroxy (Vit-D Deficiency, Fractures)    Other Visit Diagnoses    Weight loss, abnormal    -  Primary   check labs to see if thyroid medicine needs to be adjusted   Constipation, unspecified constipation type       stool cards given, return soon; check CBC, refer for colonoscopy if indicated; will check TSH and try levsin PRN; increase water and fiber   Relevant Orders   TSH   Abdominal pain, unspecified abdominal location       Relevant Orders   Urinalysis w microscopic + reflex cultur   COMPLETE METABOLIC PANEL WITH GFR   CBC with Differential/Platelet   Rectal irritation       not typical hemorrhoid; will try anusol and have her return in 2 weeks for reassessment; may be rectal tag; does not appear verrucous       Follow up plan: Return in about 2 weeks (around 03/31/2017) for follow-up visit with Dr. Sherie DonLada.  An after-visit summary was printed and given to the patient at check-out.  Please see the patient instructions which may contain other information and recommendations beyond what is mentioned above in the assessment and plan.  Meds ordered this encounter  Medications  . hydrocortisone (ANUSOL-HC) 2.5 % rectal cream    Sig: Place 1 application rectally 2 (two) times daily.    Dispense:  30 g    Refill:  0  . hyoscyamine (LEVSIN) 0.125 MG tablet    Sig: Take 1 tablet (0.125 mg total) by mouth every 6 (six) hours as needed. For bowel symptoms    Dispense:  30 tablet    Refill:  0    Orders Placed This Encounter  Procedures  . Urinalysis w microscopic + reflex cultur  . TSH  . COMPLETE  METABOLIC PANEL WITH GFR  . CBC with Differential/Platelet  . VITAMIN D 25 Hydroxy (Vit-D Deficiency, Fractures)

## 2017-03-17 NOTE — Assessment & Plan Note (Signed)
Check vit D level. 

## 2017-03-17 NOTE — Patient Instructions (Signed)
Return in two weeks for re-evaluation Let's get labs today Increase your water and fiber intake Use the new prescription cream topically where you are irritated Try the new prescription for abdominal cramps Consider a probiotic to help adjust your intestinal flora Call before your next appointment with any problems

## 2017-03-18 ENCOUNTER — Encounter: Payer: Self-pay | Admitting: Family Medicine

## 2017-03-18 ENCOUNTER — Other Ambulatory Visit: Payer: Self-pay | Admitting: Family Medicine

## 2017-03-18 DIAGNOSIS — R718 Other abnormality of red blood cells: Secondary | ICD-10-CM | POA: Insufficient documentation

## 2017-03-18 DIAGNOSIS — E079 Disorder of thyroid, unspecified: Secondary | ICD-10-CM

## 2017-03-18 LAB — URINALYSIS W MICROSCOPIC + REFLEX CULTURE
Bilirubin Urine: NEGATIVE
Glucose, UA: NEGATIVE
Hgb urine dipstick: NEGATIVE
Hyaline Cast: NONE SEEN /LPF
Leukocyte Esterase: NEGATIVE
Nitrites, Initial: NEGATIVE
Protein, ur: NEGATIVE
RBC / HPF: NONE SEEN /HPF (ref 0–2)
Specific Gravity, Urine: 1.035 (ref 1.001–1.03)
Squamous Epithelial / HPF: >=28 /HPF — AB (ref <=5)
pH: 5.5 (ref 5.0–8.0)

## 2017-03-18 LAB — CBC WITH DIFFERENTIAL/PLATELET
BASOS ABS: 60 {cells}/uL (ref 0–200)
Basophils Relative: 0.7 %
Eosinophils Absolute: 51 cells/uL (ref 15–500)
Eosinophils Relative: 0.6 %
HEMATOCRIT: 40.7 % (ref 35.0–45.0)
HEMOGLOBIN: 12.4 g/dL (ref 11.7–15.5)
LYMPHS ABS: 2839 {cells}/uL (ref 850–3900)
MCH: 22.1 pg — AB (ref 27.0–33.0)
MCHC: 30.5 g/dL — AB (ref 32.0–36.0)
MCV: 72.5 fL — ABNORMAL LOW (ref 80.0–100.0)
MPV: 10.1 fL (ref 7.5–12.5)
Monocytes Relative: 4.8 %
NEUTROS ABS: 5143 {cells}/uL (ref 1500–7800)
NEUTROS PCT: 60.5 %
PLATELETS: 340 10*3/uL (ref 140–400)
RBC: 5.61 10*6/uL — ABNORMAL HIGH (ref 3.80–5.10)
RDW: 13.1 % (ref 11.0–15.0)
Total Lymphocyte: 33.4 %
WBC mixed population: 408 cells/uL (ref 200–950)
WBC: 8.5 10*3/uL (ref 3.8–10.8)

## 2017-03-18 LAB — COMPLETE METABOLIC PANEL WITH GFR
AG Ratio: 1.6 (calc) (ref 1.0–2.5)
ALKALINE PHOSPHATASE (APISO): 89 U/L (ref 33–115)
ALT: 10 U/L (ref 6–29)
AST: 12 U/L (ref 10–30)
Albumin: 4.7 g/dL (ref 3.6–5.1)
BILIRUBIN TOTAL: 0.3 mg/dL (ref 0.2–1.2)
BUN: 9 mg/dL (ref 7–25)
CHLORIDE: 102 mmol/L (ref 98–110)
CO2: 31 mmol/L (ref 20–32)
Calcium: 9.9 mg/dL (ref 8.6–10.2)
Creat: 0.71 mg/dL (ref 0.50–1.10)
GFR, EST AFRICAN AMERICAN: 132 mL/min/{1.73_m2} (ref >=60)
GFR, Est Non African American: 113 mL/min/{1.73_m2} (ref >=60)
Globulin: 2.9 g/dL (calc) (ref 1.9–3.7)
Glucose, Bld: 97 mg/dL (ref 65–99)
POTASSIUM: 4.3 mmol/L (ref 3.5–5.3)
Sodium: 137 mmol/L (ref 135–146)
Total Protein: 7.6 g/dL (ref 6.1–8.1)

## 2017-03-18 LAB — TSH: TSH: 0.38 m[IU]/L — ABNORMAL LOW

## 2017-03-18 LAB — VITAMIN D 25 HYDROXY (VIT D DEFICIENCY, FRACTURES): VIT D 25 HYDROXY: 25 ng/mL — AB (ref 30–100)

## 2017-03-18 LAB — NO CULTURE INDICATED

## 2017-03-18 MED ORDER — LEVOTHYROXINE SODIUM 88 MCG PO TABS: 88.0000 ug | ORAL_TABLET | Freq: Every day | ORAL | 1 refills | Status: DC

## 2017-03-18 NOTE — Progress Notes (Signed)
Change dose of thyroid med Recheck TSH in 8 weeks Note to patient, ask if she's got alpha-thal or other heritable condition causing microcytosis; if news to her, will do peripheral smear and hemoglobinopathy panel

## 2017-03-18 NOTE — Telephone Encounter (Signed)
She says now she felt something, felt like a gushing; woke up and looked at it and saw where she had bled; not actively bleeding He looked at it, not still bleeding Looks like something busted Appointment at 8:20 am Wednesday morning No pus or fever

## 2017-03-18 NOTE — Telephone Encounter (Signed)
I left message; I would be very happy to see her today to look at it now that it's open and bleeding and different (may be fissure, cellulitis, abscess) Please call so we can either work her in today or discuss what's going on by phone

## 2017-03-19 ENCOUNTER — Encounter: Payer: Self-pay | Admitting: Family Medicine

## 2017-03-19 ENCOUNTER — Ambulatory Visit (INDEPENDENT_AMBULATORY_CARE_PROVIDER_SITE_OTHER): Payer: BLUE CROSS/BLUE SHIELD | Admitting: Family Medicine

## 2017-03-19 VITALS — BP 108/78 | HR 80 | Temp 97.6°F | Ht 60.0 in | Wt 126.4 lb

## 2017-03-19 DIAGNOSIS — R339 Retention of urine, unspecified: Secondary | ICD-10-CM

## 2017-03-19 DIAGNOSIS — E063 Autoimmune thyroiditis: Secondary | ICD-10-CM | POA: Diagnosis not present

## 2017-03-19 DIAGNOSIS — R32 Unspecified urinary incontinence: Secondary | ICD-10-CM

## 2017-03-19 DIAGNOSIS — N898 Other specified noninflammatory disorders of vagina: Secondary | ICD-10-CM

## 2017-03-19 DIAGNOSIS — R159 Full incontinence of feces: Secondary | ICD-10-CM

## 2017-03-19 DIAGNOSIS — K602 Anal fissure, unspecified: Secondary | ICD-10-CM

## 2017-03-19 MED ORDER — POLYETHYLENE GLYCOL 3350 17 GM/SCOOP PO POWD: 17.0000 g | Freq: Every day | ORAL | 1 refills | Status: DC

## 2017-03-19 NOTE — Assessment & Plan Note (Signed)
Recheck TSH in March; discussed different doses, increase from 50 to 100 mcg may have resulted in the rapid weight loss and low TSH

## 2017-03-19 NOTE — Progress Notes (Signed)
BP 108/78 (BP Location: Left Arm, Patient Position: Sitting, Cuff Size: Normal)   Pulse 80   Temp 97.6 F (36.4 C) (Oral)   Ht 5' (1.524 m)   Wt 126 lb 6.4 oz (57.3 kg)   LMP 03/04/2017   SpO2 99%   BMI 24.69 kg/m    Subjective:    Patient ID: Tamara Walker, female    DOB: 1985/07/17, 32 y.o.   MRN: [id number]  HPI: Tamara Walker is a 32 y.o. female  Chief Complaint  Patient presents with  . Rectal Bleeding    Pt denies pain    HPI Patient is here, see yesterday's note I spoke with her and she said that something had busted open and bled the night before last; she had her husband look at it and it was open; no pus, no fever, no pain Small vaginal deliveries, no tears, no episiotomy; no anal intercourse Minor URI stuff No abnormal vaginal bleeding Normal period, Jan 1-7 She has been constipated, and that is normal for her Since her last child, she has a problem feeling down below; sometimes wet herself a little bit; since the C-section 3 years ago; already does not feel much down there; sometimes has pain with intercourse Goes to the OB-GYN; GYN does not about decreased sensation She does feel like she might not empty bladder completely Bad constipation with last baby Needs more fiber and water Her TSH was low, and her dose of thyroid medicine was just adjusted  Depression screen [facility] 2/9 03/19/2017 03/17/2017 11/08/2016 05/03/2016 01/03/2016  Decreased Interest 0 0 0 0 0  Down, Depressed, Hopeless 0 0 0 0 0  PHQ - 2 Score 0 0 0 0 0    Relevant past medical, surgical, family and social history reviewed Past Medical History:  Diagnosis Date  . Thyroid disease   . Vitamin D deficiency    Past Surgical History:  Procedure Laterality Date  . CESAREAN SECTION    . EYE SURGERY    . TUBAL LIGATION  2016   Family History  Problem Relation Age of Onset  . Dementia Father   . Diabetes Maternal Grandfather   . Diabetes Paternal Grandmother   . Cancer Neg Hx   . Heart  disease Neg Hx    Social History   Tobacco Use  . Smoking status: Current Every Day Smoker    Packs/day: 0.25    Years: 15.00    Pack years: 3.75    Types: Cigarettes  . Smokeless tobacco: Never Used  Substance Use Topics  . Alcohol use: Yes    Alcohol/week: 0.0 oz    Comment: occasional 1-2x/yr  . Drug use: No    Interim medical history since last visit reviewed. Allergies and medications reviewed  Review of Systems Per HPI unless specifically indicated above     Objective:    BP 108/78 (BP Location: Left Arm, Patient Position: Sitting, Cuff Size: Normal)   Pulse 80   Temp 97.6 F (36.4 C) (Oral)   Ht 5' (1.524 m)   Wt 126 lb 6.4 oz (57.3 kg)   LMP 03/04/2017   SpO2 99%   BMI 24.69 kg/m   Wt Readings from Last 3 Encounters:  03/19/17 126 lb 6.4 oz (57.3 kg)  03/17/17 125 lb 12.8 oz (57.1 kg)  12/24/16 142 lb 3.2 oz (64.5 kg)    Physical Exam  Constitutional: She appears well-developed and well-nourished.  Weight loss noted  HENT:  Mouth/Throat: Mucous membranes are normal.  Eyes: EOM are normal. No scleral icterus.  Cardiovascular: Normal rate and regular rhythm.  Pulmonary/Chest: Effort normal and breath sounds normal.  Abdominal: Soft. Normal appearance and bowel sounds are normal. She exhibits no distension. There is no tenderness.  Genitourinary: Rectal exam shows fissure (perineal fissure). Rectal exam shows no internal hemorrhoid, no mass, no tenderness and anal tone normal.    There is no rash or lesion on the right labia. There is no rash or lesion on the left labia. No erythema, tenderness or bleeding in the vagina. No foreign body in the vagina. No signs of injury around the vagina. Vaginal discharge found.  Genitourinary Comments: Erythematous perineal fissure; no bleeding; not fimbriated, not verrucous; no active bleeding or drainage  Skin: She is not diaphoretic. No pallor.  Psychiatric: She has a normal mood and affect. Her behavior is normal.      Results for orders placed or performed in visit on 03/17/17  Urinalysis w microscopic + reflex cultur  Result Value Ref Range   Color, Urine YELLOW YELLOW   APPearance TURBID (A) CLEAR   Specific Gravity, Urine 1.035 1.001 - 1.03   pH 5.5 5.0 - 8.0   Glucose, UA NEGATIVE NEGATIVE   Bilirubin Urine NEGATIVE NEGATIVE   Ketones, ur TRACE (A) NEGATIVE   Hgb urine dipstick NEGATIVE NEGATIVE   Protein, ur NEGATIVE NEGATIVE   Nitrites, Initial NEGATIVE NEGATIVE   Leukocyte Esterase NEGATIVE NEGATIVE   WBC, UA 0-5 0 - 5 /HPF   RBC / HPF NONE SEEN 0 - 2 /HPF   Squamous Epithelial / LPF > OR = 28 (A) < OR = 5 /HPF   Bacteria, UA FEW (A) NONE SEEN /HPF   AMORPHOUS SEDIMENT MANY (A) NONE OR FE /HPF   Hyaline Cast NONE SEEN NONE SEEN /LPF  TSH  Result Value Ref Range   TSH 0.38 (L) mIU/L  COMPLETE METABOLIC PANEL WITH GFR  Result Value Ref Range   Glucose, Bld 97 65 - 99 mg/dL   BUN 9 7 - 25 mg/dL   Creat 9.60 4.54 - 0.98 mg/dL   GFR, Est Non African American 113 > OR = 60 mL/min/1.79m2   GFR, Est African American 132 > OR = 60 mL/min/1.3m2   BUN/Creatinine Ratio NOT APPLICABLE 6 - 22 (calc)   Sodium 137 135 - 146 mmol/L   Potassium 4.3 3.5 - 5.3 mmol/L   Chloride 102 98 - 110 mmol/L   CO2 31 20 - 32 mmol/L   Calcium 9.9 8.6 - 10.2 mg/dL   Total Protein 7.6 6.1 - 8.1 g/dL   Albumin 4.7 3.6 - 5.1 g/dL   Globulin 2.9 1.9 - 3.7 g/dL (calc)   AG Ratio 1.6 1.0 - 2.5 (calc)   Total Bilirubin 0.3 0.2 - 1.2 mg/dL   Alkaline phosphatase (APISO) 89 33 - 115 U/L   AST 12 10 - 30 U/L   ALT 10 6 - 29 U/L  CBC with Differential/Platelet  Result Value Ref Range   WBC 8.5 3.8 - 10.8 Thousand/uL   RBC 5.61 (H) 3.80 - 5.10 Million/uL   Hemoglobin 12.4 11.7 - 15.5 g/dL   HCT 11.9 14.7 - 82.9 %   MCV 72.5 (L) 80.0 - 100.0 fL   MCH 22.1 (L) 27.0 - 33.0 pg   MCHC 30.5 (L) 32.0 - 36.0 g/dL   RDW 56.2 13.0 - 86.5 %   Platelets 340 140 - 400 Thousand/uL   MPV 10.1 7.5 - 12.5 fL   Neutro  Abs  5,143 1,500 - 7,800 cells/uL   Lymphs Abs 2,839 850 - 3,900 cells/uL   WBC mixed population 408 200 - 950 cells/uL   Eosinophils Absolute 51 15 - 500 cells/uL   Basophils Absolute 60 0 - 200 cells/uL   Neutrophils Relative % 60.5 %   Total Lymphocyte 33.4 %   Monocytes Relative 4.8 %   Eosinophils Relative 0.6 %   Basophils Relative 0.7 %  VITAMIN D 25 Hydroxy (Vit-D Deficiency, Fractures)  Result Value Ref Range   Vit D, 25-Hydroxy 25 (L) 30 - 100 ng/mL  REFLEXIVE URINE CULTURE  Result Value Ref Range   Reflexve Urine Culture NO CULTURE INDICATED       Assessment & Plan:   Problem List Items Addressed This Visit      Endocrine   Hashimoto's thyroiditis    Recheck TSH in March; discussed different doses, increase from 50 to 100 mcg may have resulted in the rapid weight loss and low TSH       Other Visit Diagnoses    Perineal fissure    -  Primary   sitz baths; no intercourse until healed; see AVS   Incomplete bladder emptying       start with pelvic floor PT, may continue on to urology referral   Relevant Orders   Ambulatory referral to Physical Therapy   Vaginal discharge       wet mount collected   Relevant Orders   WET PREP BY MOLECULAR PROBE   Urinary and fecal incontinence       Relevant Orders   Ambulatory referral to Physical Therapy       Follow up plan: Return in about 8 weeks (around 05/14/2017) for follow-up visit with Dr. Sherie DonLada.  An after-visit summary was printed and given to the patient at check-out.  Please see the patient instructions which may contain other information and recommendations beyond what is mentioned above in the assessment and plan.  Meds ordered this encounter  Medications  . polyethylene glycol powder (GLYCOLAX/MIRALAX) powder    Sig: Take 17 g by mouth daily.    Dispense:  3350 g    Refill:  1    Orders Placed This Encounter  Procedures  . WET PREP BY MOLECULAR PROBE  . Ambulatory referral to Physical Therapy

## 2017-03-19 NOTE — Patient Instructions (Addendum)
We'll have you do sitz baths I'm going to start with pelvic floor physical therapy assessment and treatment As you progress, we can refer you to a urologist or gynecologist as indicated Use the miralax every day Increase your fiber and water intake  I do encourage you to quit smoking Call [phone] to sign up for smoking cessation classes You can call 1-800-QUIT-NOW to talk with a smoking cessation coach   Care of a Perineal Tear A perineal tear is a cut (laceration) in the tissue between the opening of the vagina and the anus (perineum). Some women naturally develop a perineal tear during a vaginal birth. This can happen as the baby emerges from the birth canal and the perineum is stretched. Perineal tears are graded based on how deep and long the laceration is. The grading for perineal tears is as follows:  First degree. This involves a shallow tear at the edge of the vaginal opening that extends slightly into the perineal skin.  Second degree. This involves tearing described in a first degree perineal tear and also a deeper tear of the vaginal opening and perineal tissues. It may also include tearing of a muscle just under the perineal skin.  Third degree. This involves tearing described in a first and second degree perineal tear, with the tear extending into the muscle of the anus (anal sphincter).  Fourth degree. This involves all levels of tear described for first, second, and third degree perineal tear, with the tear extending into the rectum.  First degree perineal tears may or may not be stitched closed, depending on their location and appearance. Second, third, and fourth degree perineal tears are stitched closed immediately after the baby's birth. What are the risks? Depending on the type of perineal tear you have, you may be at risk for the following:  Bleeding.  Developing a collection of blood in the perineal tear area (hematoma).  Pain. This may include pain with  urination or bowel movements.  Infection at the site of the tear.  Fever.  Trouble controlling your bowels (fecal incontinence).  Painful sexual intercourse.  How to care for a perineal tear  The first day, put ice on the area of the tear. ? Put ice in a plastic bag. ? Place a towel between your skin and the bag. ? Leave the ice on for 20 minutes, 2-3 times a day.  Bathe using a warm sitz bath as directed by your health care provider. This can speed up healing. Sitz baths can be performed in your bathtub or using a sitz bath kit that fits over your toilet. ? Place 3-4 in. (7.6-10 cm) of warm water in your bathtub or fill the sitz bath over-the-toilet container with warm water. Make sure the water is not too hot by placing a drop on your wrist. ? Sit in the warm water for 20-30 minutes. ? After bathing, pat your perineum dry with a clean towel. Do not scrub the perineum as this could cause pain, irritation, or open any stitches you may have. ? Keep the over-the-toilet sitz bath container clean by rinsing it thoroughly after each use. Ask for help in keeping the bathtub clean with diluted bleach and water (2 Tbsp [30 mL] of bleach to  gal [1.9 L] of water). ? Repeat the sitz bath as often as you would like to relieve perineal pain, itching, or discomfort.  Apply a numbing spray to the perineal tear site as directed by your health care provider. This may help  with discomfort.  Wash your hands before and after applying medicine to the area.  Put about 3 witch hazel-containing hemorrhoid treatment pads on top of your sanitary pad. The witch hazel in the hemorrhoid pads helps with discomfort and swelling.  Get a squeeze bottle to squeeze warm water on your perineum when urinating, spraying the area from front to back. Pat the area to dry it.  Sitting on an inflatable ring or pillow may provide comfort.  Take medicines only as directed by your health care provider.  Do not have sexual  intercourse or use tampons until your health care provider says it is okay. Typically, you must wait at least 6 weeks.  Keep all postpartum appointments as directed by your health care provider. Contact a health care provider if:  Your pain is not relieved with medicines.  You have painful urination.  You have a fever. Get help right away if:  You have redness, swelling, or increasing pain in the area of the tear.  You have pus coming from the area of the tear.  You notice a bad smell coming from the area of the tear.  Your tear opens.  You notice swelling in the area of the tear that is larger than when you left the hospital.  You cannot urinate. This information is not intended to replace advice given to you by your health care provider. Make sure you discuss any questions you have with your health care provider. Document Released: 07/05/2013 Document Revised: 08/02/2015 Document Reviewed: 11/24/2012 Elsevier Interactive Patient Education  2017 [employer].

## 2017-03-20 ENCOUNTER — Other Ambulatory Visit: Payer: Self-pay | Admitting: Family Medicine

## 2017-03-20 LAB — WET PREP BY MOLECULAR PROBE
CANDIDA SPECIES: NOT DETECTED
MICRO NUMBER:: 90067469
SPECIMEN QUALITY:: ADEQUATE
TRICHOMONAS VAG: NOT DETECTED

## 2017-03-20 MED ORDER — METRONIDAZOLE 500 MG PO TABS: 500.0000 mg | ORAL_TABLET | Freq: Two times a day (BID) | ORAL | 0 refills | Status: DC

## 2017-03-20 NOTE — Progress Notes (Signed)
rx sent for BV

## 2017-04-11 ENCOUNTER — Telehealth: Payer: Self-pay | Admitting: Family Medicine

## 2017-04-11 ENCOUNTER — Ambulatory Visit: Payer: BLUE CROSS/BLUE SHIELD | Admitting: Physical Therapy

## 2017-04-11 NOTE — Telephone Encounter (Signed)
Copied from CRM [id number]. Topic: Quick Communication - Rx Refill/Question >> Apr 11, 2017 12:29 PM Louie BunPalacios Medina, Rosey Batheresa D wrote: Medication: levothyroxine (SYNTHROID, LEVOTHROID) 88 MCG tablet   Has the patient contacted their pharmacy? No  (Agent: If no, request that the patient contact the pharmacy for the refill.)   Preferred Pharmacy (with phone number or street name): [facility] [address] - [place], [place] - [phone]   Agent: Please be advised that RX refills may take up to 3 business days. We ask that you follow-up with your pharmacy.

## 2017-04-11 NOTE — Telephone Encounter (Signed)
Pt requesting a refill on the levothyroxine. LOV  03/19/17   Last refill 03/18/17  [facility] [address] [place]

## 2017-04-13 ENCOUNTER — Encounter: Payer: Self-pay | Admitting: Family Medicine

## 2017-04-16 ENCOUNTER — Encounter: Payer: BLUE CROSS/BLUE SHIELD | Admitting: Physical Therapy

## 2017-04-18 ENCOUNTER — Telehealth: Payer: Self-pay | Admitting: Family Medicine

## 2017-04-18 ENCOUNTER — Encounter: Payer: BLUE CROSS/BLUE SHIELD | Admitting: Physical Therapy

## 2017-04-18 NOTE — Telephone Encounter (Signed)
Copied from CRM [id number]. Topic: Quick Communication - See Telephone Encounter >> Apr 18, 2017 11:36 AM Everardo PacificMoton, Yer Olivencia, NT wrote: CRM for notification. Patient calling because [facility] don't have the brand of Levothyroxine that she needs and wanted to know if it was ok if they use a diiferent brand. [facility] [address] [place] [phone]  04/18/17.

## 2017-04-21 NOTE — Telephone Encounter (Signed)
Okay for change to generic but we'll need to check TSH 8 weeks after change Please send one month plus 1 refill (I'm using [facility], no Rx linked to this msg, and I don't know how to add a Rx) Call pt to let her know Please order TSH (again, on [facility], don't know how to add yet) Thank you

## 2017-04-21 NOTE — Telephone Encounter (Signed)
[facility] the [facility] and the patient is on the Generic levothyroxine  and its on back order and the only brand they have is Mylan for levothyroxine. Would it be ok to change to Mylan ?

## 2017-04-21 NOTE — Telephone Encounter (Signed)
Pt returned call - please send RX to    [facility] [address] - [place] - [address] [phone] (Phone) [phone] (Fax)

## 2017-04-21 NOTE — Telephone Encounter (Signed)
[facility] [address]. In the message it states [address] Reached out to the patient to confirm which pharmacy she wanted.

## 2017-04-22 ENCOUNTER — Other Ambulatory Visit: Payer: Self-pay

## 2017-04-22 ENCOUNTER — Encounter: Payer: Self-pay | Admitting: Family Medicine

## 2017-04-22 DIAGNOSIS — E063 Autoimmune thyroiditis: Secondary | ICD-10-CM

## 2017-04-22 DIAGNOSIS — E079 Disorder of thyroid, unspecified: Secondary | ICD-10-CM

## 2017-04-22 MED ORDER — LEVOTHYROXINE SODIUM 88 MCG PO TABS: 88.0000 ug | ORAL_TABLET | Freq: Every day | ORAL | 1 refills | Status: DC

## 2017-04-22 NOTE — Telephone Encounter (Signed)
Sorry, not sure how to change medication brand to Mylan and update in chart.

## 2017-04-22 NOTE — Telephone Encounter (Signed)
No need for us to change the generic manufacturer in the chart Pharmacy just needs our permission to do it I sent more refills of the thyroid med Thank you

## 2017-04-22 NOTE — Telephone Encounter (Signed)
Pharmacy notified.

## 2017-04-22 NOTE — Telephone Encounter (Signed)
I called the pharmacy personally to make sure they received our earlier message that's okay to switch generic, get TSH 8 weeks after switch [facility] she says they DID get our okay for the switch and she is good to go TSH is already ordered New Rx for thyroid med just sent earlier

## 2017-04-22 NOTE — Addendum Note (Signed)
Addended by: LADA, Janit BernMELINDA P on: 04/22/2017 12:49 PM   Modules accepted: Orders

## 2017-05-02 ENCOUNTER — Encounter: Payer: BLUE CROSS/BLUE SHIELD | Admitting: Physical Therapy

## 2017-05-09 ENCOUNTER — Encounter: Payer: BLUE CROSS/BLUE SHIELD | Admitting: Physical Therapy

## 2017-05-14 ENCOUNTER — Ambulatory Visit: Payer: BLUE CROSS/BLUE SHIELD | Admitting: Family Medicine

## 2017-05-16 ENCOUNTER — Encounter: Payer: BLUE CROSS/BLUE SHIELD | Admitting: Physical Therapy

## 2017-05-16 ENCOUNTER — Other Ambulatory Visit: Payer: Self-pay

## 2017-05-16 ENCOUNTER — Encounter: Payer: BLUE CROSS/BLUE SHIELD | Admitting: Certified Nurse Midwife

## 2017-05-16 DIAGNOSIS — E079 Disorder of thyroid, unspecified: Secondary | ICD-10-CM

## 2017-05-16 DIAGNOSIS — E063 Autoimmune thyroiditis: Secondary | ICD-10-CM

## 2017-05-17 LAB — TSH: TSH: 1.43 mIU/L

## 2017-05-18 ENCOUNTER — Other Ambulatory Visit: Payer: Self-pay | Admitting: Family Medicine

## 2017-05-18 MED ORDER — LEVOTHYROXINE SODIUM 88 MCG PO TABS: 88.0000 ug | ORAL_TABLET | Freq: Every day | ORAL | 11 refills | Status: DC

## 2017-05-18 NOTE — Progress Notes (Signed)
Normal TSH; one year of refill's provided

## 2017-05-23 ENCOUNTER — Ambulatory Visit (INDEPENDENT_AMBULATORY_CARE_PROVIDER_SITE_OTHER): Payer: BLUE CROSS/BLUE SHIELD | Admitting: Certified Nurse Midwife

## 2017-05-23 ENCOUNTER — Encounter: Payer: BLUE CROSS/BLUE SHIELD | Admitting: Physical Therapy

## 2017-05-23 ENCOUNTER — Encounter: Payer: Self-pay | Admitting: Certified Nurse Midwife

## 2017-05-23 VITALS — BP 118/71 | HR 78 | Ht 61.0 in | Wt 123.5 lb

## 2017-05-23 DIAGNOSIS — N898 Other specified noninflammatory disorders of vagina: Secondary | ICD-10-CM | POA: Diagnosis not present

## 2017-05-23 NOTE — Progress Notes (Signed)
GYNECOLOGY ANNUAL PREVENTATIVE CARE ENCOUNTER NOTE  Subjective:   Tamara Walker is a 32 y.o. [phone] female here for a routine annual gynecologic exam.  Current complaints: increased vaginal discharge. She denies burning, odor, or pain but admits that she has history ob BV.  Denies abnormal vaginal bleeding, pelvic pain, problems with intercourse or other gynecologic concerns.    Gynecologic History Patient's last menstrual period was 05/07/2017. Contraception: tubal ligation Last Pap: 04/28/2015. Results were: normal -HPV neg. Repeat due 04/2020 Last mammogram: n/a.   Obstetric History OB History  Gravida Para Term Preterm AB Living  5 3 3   2 3   SAB TAB Ectopic Multiple Live Births  1 1     3     # Outcome Date GA Lbr Len/2nd Weight Sex Delivery Anes PTL Lv  5 Term 04/11/14    M CS-Unspec   LIV  4 TAB 2013 [redacted]w[redacted]d         3 Term 08/15/08    F Vag-Spont   LIV  2 Term 04/07/05    F Vag-Spont   LIV  1 SAB 2004 [redacted]w[redacted]d           Past Medical History:  Diagnosis Date  . Thyroid disease   . Vitamin D deficiency     Past Surgical History:  Procedure Laterality Date  . CESAREAN SECTION    . EYE SURGERY    . TUBAL LIGATION  2016    Current Outpatient Medications on File Prior to Visit  Medication Sig Dispense Refill  . levothyroxine (SYNTHROID, LEVOTHROID) 88 MCG tablet Take 1 tablet (88 mcg total) by mouth daily. (have labs done 8 weeks after new dose) 30 tablet 11  . eszopiclone (LUNESTA) 1 MG TABS tablet Take 1 tablet (1 mg total) by mouth at bedtime as needed for sleep. Take immediately before bedtime 30 tablet 0  . hyoscyamine (LEVSIN) 0.125 MG tablet Take 1 tablet (0.125 mg total) by mouth every 6 (six) hours as needed. For bowel symptoms (Patient not taking: Reported on 03/19/2017) 30 tablet 0  . metroNIDAZOLE (FLAGYL) 500 MG tablet Take 1 tablet (500 mg total) by mouth 2 (two) times daily. No alcohol or cold medicines that contain alcohol while on this med (Patient not taking:  Reported on 05/23/2017) 14 tablet 0  . polyethylene glycol powder (GLYCOLAX/MIRALAX) powder Take 17 g by mouth daily. (Patient not taking: Reported on 05/23/2017) 3350 g 1  . triamcinolone cream (KENALOG) 0.5 % Apply 1 application topically 3 (three) times daily. **Too strong for face, axilla, and groin areas** (Patient not taking: Reported on 03/17/2017) 30 g 0   No current facility-administered medications on file prior to visit.     No Known Allergies  Social History   Socioeconomic History  . Marital status: Married    Spouse name: Not on file  . Number of children: Not on file  . Years of education: Not on file  . Highest education level: Not on file  Occupational History  . Not on file  Social Needs  . Financial resource strain: Not on file  . Food insecurity:    Worry: Not on file    Inability: Not on file  . Transportation needs:    Medical: Not on file    Non-medical: Not on file  Tobacco Use  . Smoking status: Current Every Day Smoker    Packs/day: 0.25    Years: 15.00    Pack years: 3.75    Types: Cigarettes  . Smokeless tobacco:  Never Used  Substance and Sexual Activity  . Alcohol use: Yes    Alcohol/week: 0.0 oz    Comment: occasional 1-2x/yr  . Drug use: No  . Sexual activity: Yes    Partners: Male    Birth control/protection: Surgical  Lifestyle  . Physical activity:    Days per week: Not on file    Minutes per session: Not on file  . Stress: Not on file  Relationships  . Social connections:    Talks on phone: Not on file    Gets together: Not on file    Attends religious service: Not on file    Active member of club or organization: Not on file    Attends meetings of clubs or organizations: Not on file    Relationship status: Not on file  . Intimate partner violence:    Fear of current or ex partner: Not on file    Emotionally abused: Not on file    Physically abused: Not on file    Forced sexual activity: Not on file  Other Topics Concern  .  Not on file  Social History Narrative  . Not on file    Family History  Problem Relation Age of Onset  . Dementia Father   . Diabetes Maternal Grandfather   . Diabetes Paternal Grandmother   . Cancer Neg Hx   . Heart disease Neg Hx     The following portions of the patient's history were reviewed and updated as appropriate: allergies, current medications, past family history, past medical history, past social history, past surgical history and problem list.  Review of Systems Pertinent items noted in HPI and remainder of comprehensive ROS otherwise negative.   Objective:  BP 118/71   Pulse 78   Ht 5\' 1"  (1.549 m)   Wt 123 lb 8 oz (56 kg)   LMP 05/07/2017   BMI 23.34 kg/m  CONSTITUTIONAL: Well-developed, well-nourished female in no acute distress.  HENT:  Normocephalic, atraumatic, External right and left ear normal. Oropharynx is clear and moist EYES: Conjunctivae and EOM are normal. Pupils are equal, round, and reactive to light. No scleral icterus.  NECK: Normal range of motion, supple, no masses.  Normal thyroid.  SKIN: Skin is warm and dry. No rash noted. Not diaphoretic. No erythema. No pallor. NEUROLOGIC: Alert and oriented to person, place, and time. Normal reflexes, muscle tone coordination. No cranial nerve deficit noted. PSYCHIATRIC: Normal mood and affect. Normal behavior. Normal judgment and thought content. CARDIOVASCULAR: Normal heart rate noted, regular rhythm RESPIRATORY: Clear to auscultation bilaterally. Effort and breath sounds normal, no problems with respiration noted. BREASTS: Symmetric in size. No masses, skin changes, nipple drainage, or lymphadenopathy. Pt noted that she feels her left breast has indentation since that occurred after having her child. Nothing seen or felt on exam ABDOMEN: Soft, normal bowel sounds, no distention noted.  No tenderness, rebound or guarding.  PELVIC: Normal appearing external genitalia; normal appearing vaginal mucosa and  cervix.  No abnormal discharge noted.  Pap smear obtained.  Normal uterine size, no other palpable masses, no uterine or adnexal tenderness. MUSCULOSKELETAL: Normal range of motion. No tenderness.  No cyanosis, clubbing, or edema.  2+ distal pulses.   Assessment and Plan:  Annual physical exam  Pap not indicated Mammogram Not indicated Labs: pt state followed by endocrinology and primary Nuswab today for increased vaginal discharge. Discussed use of boric acid.  Routine preventative health maintenance measures emphasized. Please refer to After Visit Summary for other  counseling recommendations.   Doreene Burke, CNM

## 2017-05-23 NOTE — Patient Instructions (Signed)
Preventive Care 18-39 Years, Female Preventive care refers to lifestyle choices and visits with your health care provider that can promote health and wellness. What does preventive care include?  A yearly physical exam. This is also called an annual well check.  Dental exams once or twice a year.  Routine eye exams. Ask your health care provider how often you should have your eyes checked.  Personal lifestyle choices, including: ? Daily care of your teeth and gums. ? Regular physical activity. ? Eating a healthy diet. ? Avoiding tobacco and drug use. ? Limiting alcohol use. ? Practicing safe sex. ? Taking vitamin and mineral supplements as recommended by your health care provider. What happens during an annual well check? The services and screenings done by your health care provider during your annual well check will depend on your age, overall health, lifestyle risk factors, and family history of disease. Counseling Your health care provider may ask you questions about your:  Alcohol use.  Tobacco use.  Drug use.  Emotional well-being.  Home and relationship well-being.  Sexual activity.  Eating habits.  Work and work Statistician.  Method of birth control.  Menstrual cycle.  Pregnancy history.  Screening You may have the following tests or measurements:  Height, weight, and BMI.  Diabetes screening. This is done by checking your blood sugar (glucose) after you have not eaten for a while (fasting).  Blood pressure.  Lipid and cholesterol levels. These may be checked every 5 years starting at age 66.  Skin check.  Hepatitis C blood test.  Hepatitis B blood test.  Sexually transmitted disease (STD) testing.  BRCA-related cancer screening. This may be done if you have a family history of breast, ovarian, tubal, or peritoneal cancers.  Pelvic exam and Pap test. This may be done every 3 years starting at age 40. Starting at age 59, this may be done every 5  years if you have a Pap test in combination with an HPV test.  Discuss your test results, treatment options, and if necessary, the need for more tests with your health care provider. Vaccines Your health care provider may recommend certain vaccines, such as:  Influenza vaccine. This is recommended every year.  Tetanus, diphtheria, and acellular pertussis (Tdap, Td) vaccine. You may need a Td booster every 10 years.  Varicella vaccine. You may need this if you have not been vaccinated.  HPV vaccine. If you are 69 or younger, you may need three doses over 6 months.  Measles, mumps, and rubella (MMR) vaccine. You may need at least one dose of MMR. You may also need a second dose.  Pneumococcal 13-valent conjugate (PCV13) vaccine. You may need this if you have certain conditions and were not previously vaccinated.  Pneumococcal polysaccharide (PPSV23) vaccine. You may need one or two doses if you smoke cigarettes or if you have certain conditions.  Meningococcal vaccine. One dose is recommended if you are age 27-21 years and a first-year college student living in a residence hall, or if you have one of several medical conditions. You may also need additional booster doses.  Hepatitis A vaccine. You may need this if you have certain conditions or if you travel or work in places where you may be exposed to hepatitis A.  Hepatitis B vaccine. You may need this if you have certain conditions or if you travel or work in places where you may be exposed to hepatitis B.  Haemophilus influenzae type b (Hib) vaccine. You may need this if  you have certain risk factors.  Talk to your health care provider about which screenings and vaccines you need and how often you need them. This information is not intended to replace advice given to you by your health care provider. Make sure you discuss any questions you have with your health care provider. Document Released: 04/16/2001 Document Revised: 11/08/2015  Document Reviewed: 12/20/2014 Elsevier Interactive Patient Education  [employer].

## 2017-05-27 ENCOUNTER — Encounter: Payer: Self-pay | Admitting: Certified Nurse Midwife

## 2017-05-27 ENCOUNTER — Other Ambulatory Visit: Payer: Self-pay | Admitting: Certified Nurse Midwife

## 2017-05-27 LAB — NUSWAB BV AND CANDIDA, NAA
Atopobium vaginae: HIGH Score — AB
BVAB 2: HIGH {score} — AB
CANDIDA ALBICANS, NAA: NEGATIVE
CANDIDA GLABRATA, NAA: NEGATIVE

## 2017-05-27 MED ORDER — METRONIDAZOLE 500 MG PO TABS: 500.0000 mg | ORAL_TABLET | Freq: Two times a day (BID) | ORAL | 0 refills | Status: DC

## 2017-05-27 NOTE — Progress Notes (Signed)
Patient nuswab positive for BV, she was notified via my chart message.   Doreene BurkeAnnie Udell Mazzocco, CNM

## 2017-05-30 ENCOUNTER — Encounter: Payer: BLUE CROSS/BLUE SHIELD | Admitting: Physical Therapy

## 2017-11-06 ENCOUNTER — Encounter: Payer: Self-pay | Admitting: Family Medicine

## 2017-12-17 IMAGING — CR DG CHEST 2V
1 series · 2 of 2 positions shown · non-contrast
Comparison: None

CLINICAL DATA: Sharp LEFT side chest pain since yesterday without
relief, smoker

EXAM:
CHEST  2 VIEW

[Series 1: dg chest 2 view · 0.14mm/px · 2 of 2 slices shown]
[im 1/2]
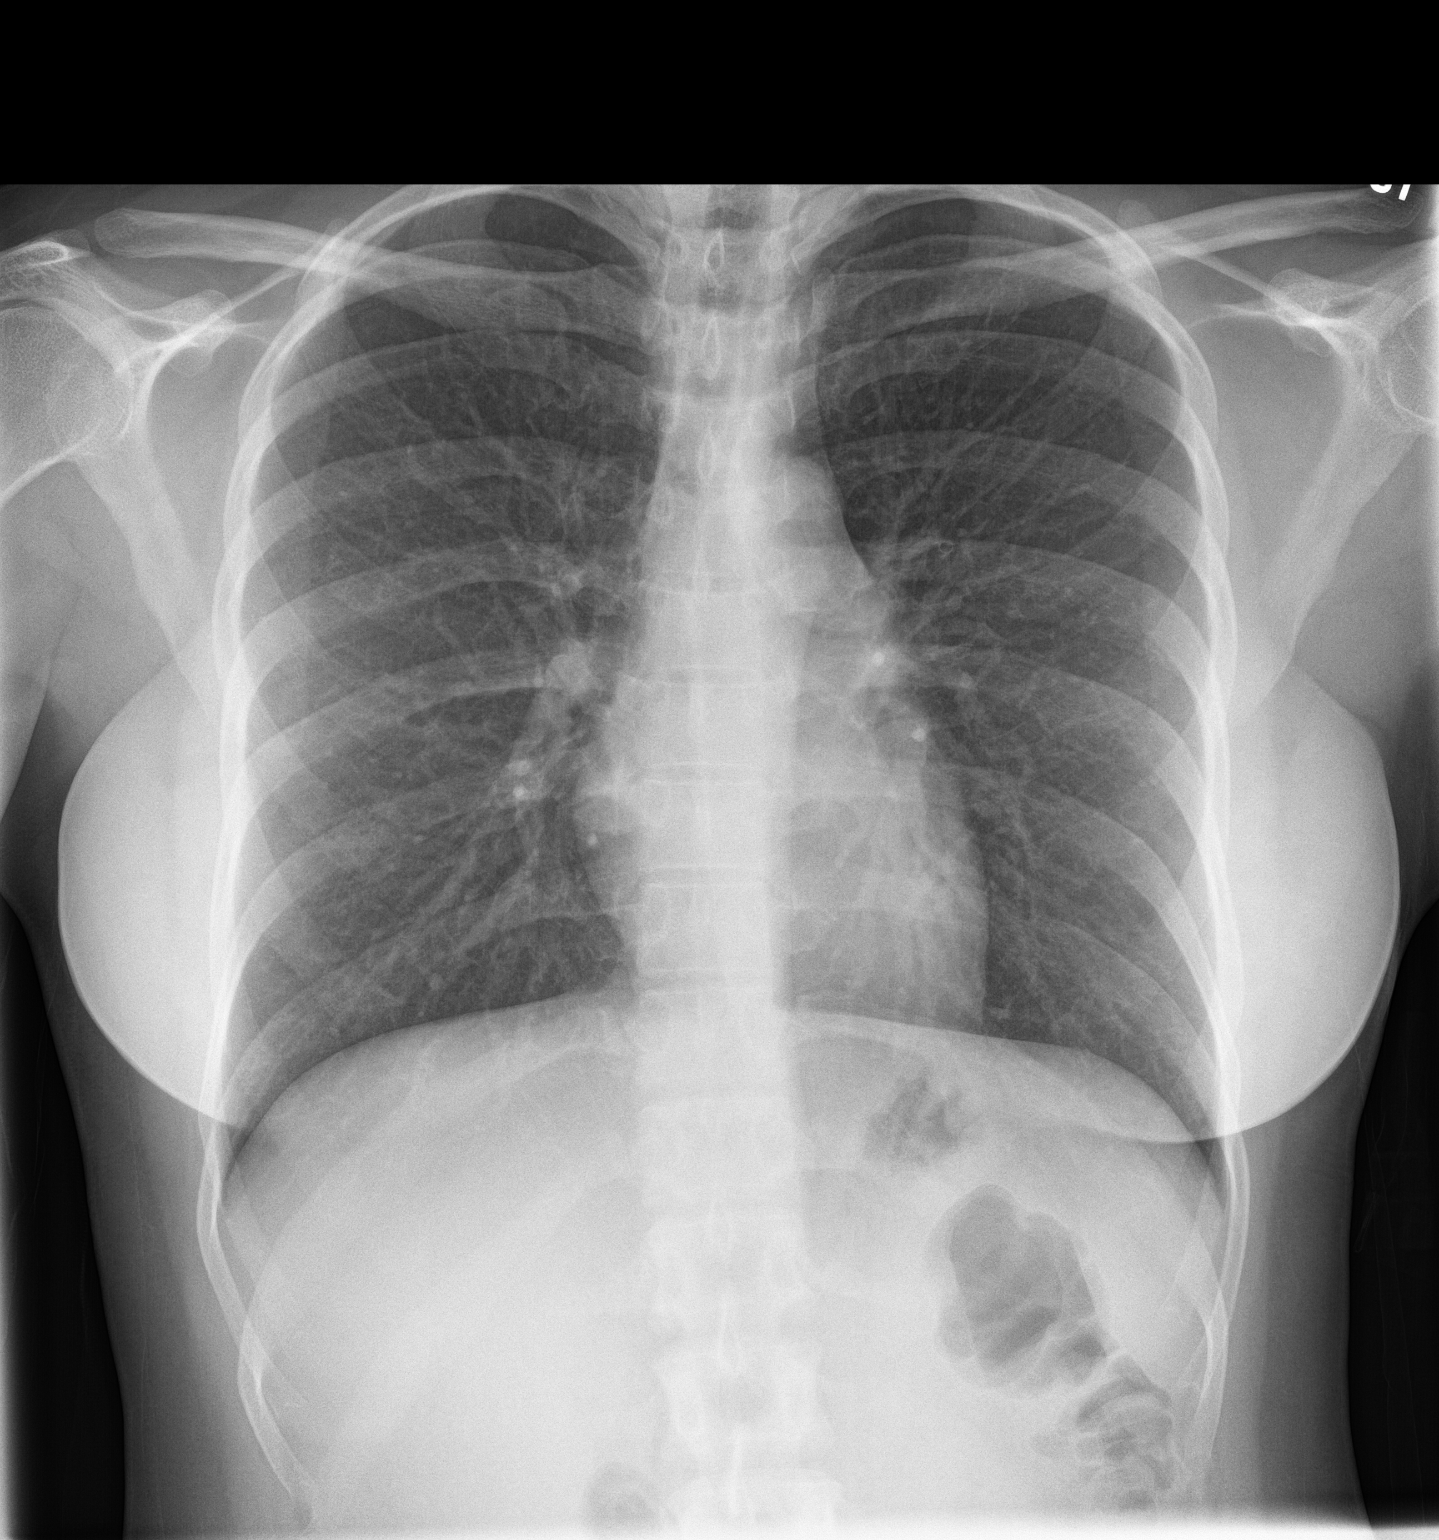
[im 2/2]
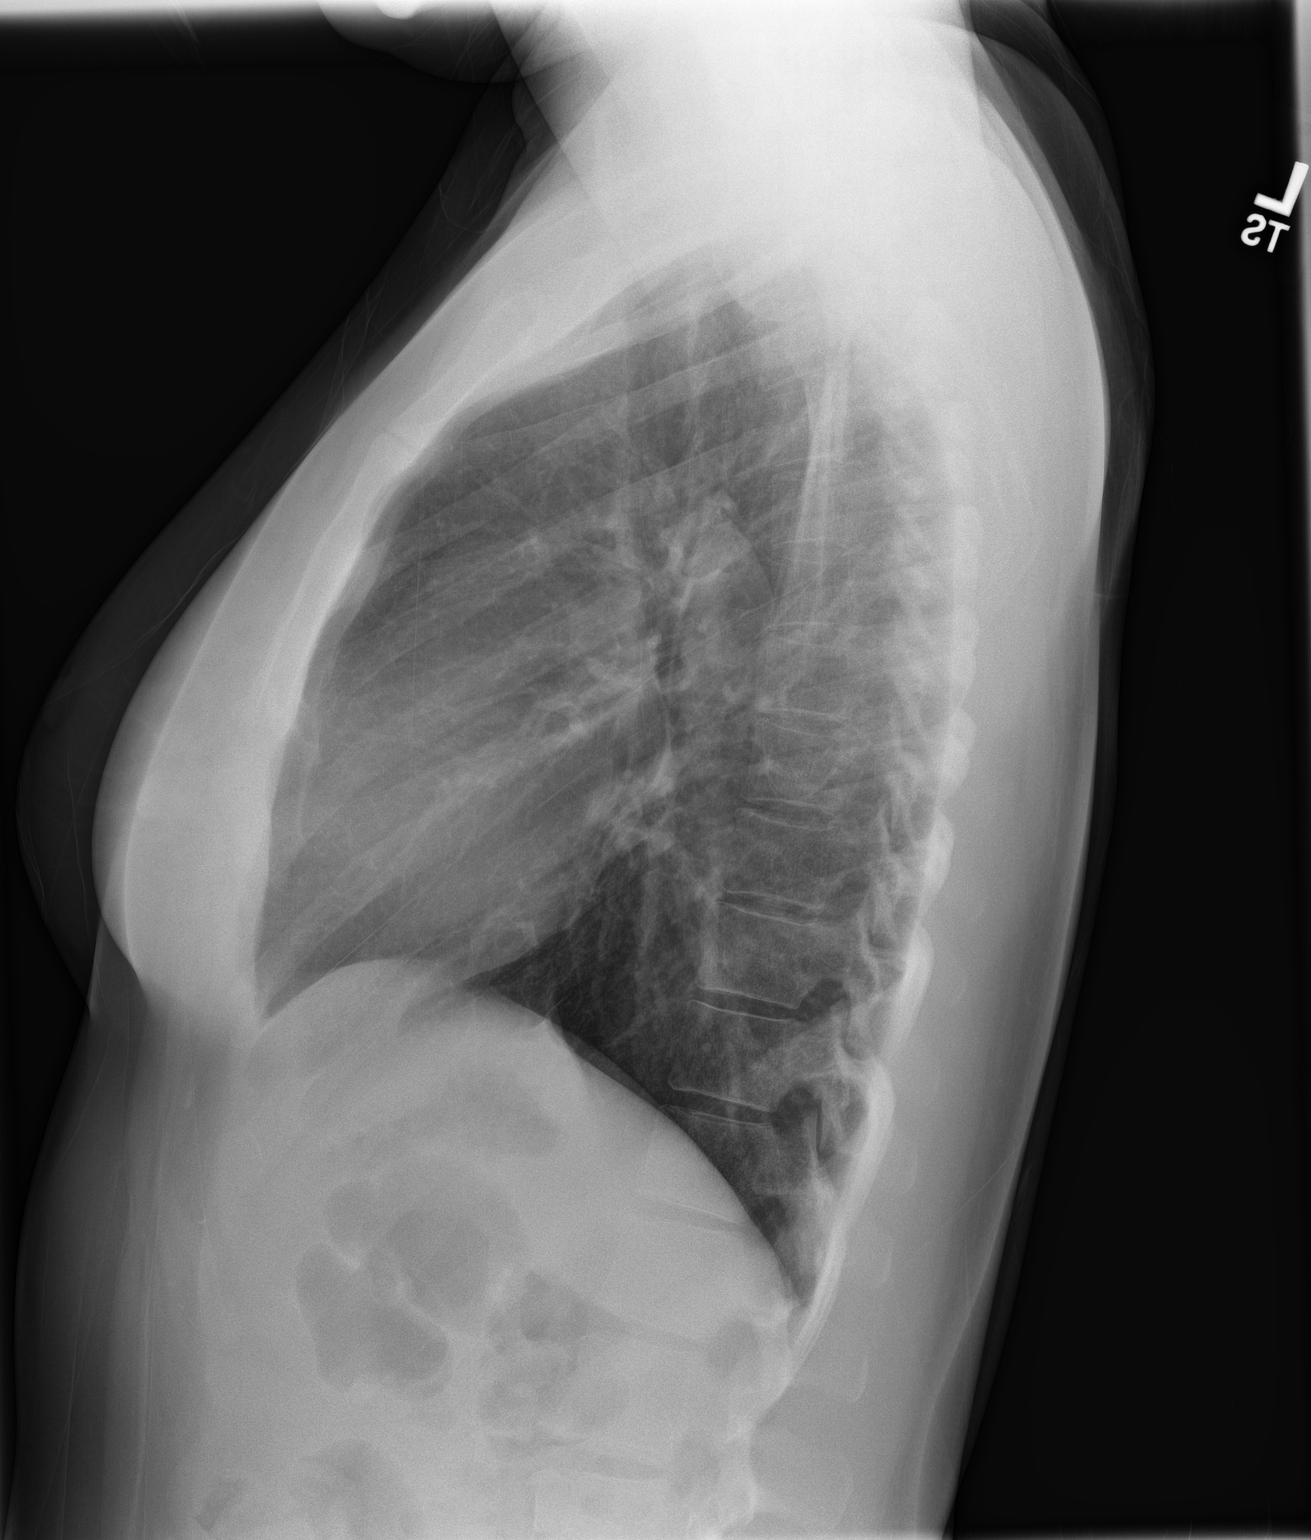

[2 of 2 positions shown; findings below may reference images not displayed]

FINDINGS: Normal heart size, mediastinal contours, and pulmonary vascularity.

Lungs clear.

No pleural effusion or pneumothorax.

Bones unremarkable.
IMPRESSION: No acute abnormalities.

## 2018-02-11 ENCOUNTER — Other Ambulatory Visit: Payer: Self-pay | Admitting: Family Medicine

## 2018-02-11 DIAGNOSIS — L301 Dyshidrosis [pompholyx]: Secondary | ICD-10-CM

## 2018-02-13 MED ORDER — TRIAMCINOLONE ACETONIDE 0.5 % EX CREA: 1.0000 "application " | TOPICAL_CREAM | Freq: Three times a day (TID) | CUTANEOUS | 0 refills | Status: DC | PRN

## 2018-03-18 ENCOUNTER — Encounter: Payer: BLUE CROSS/BLUE SHIELD | Admitting: Certified Nurse Midwife

## 2018-05-15 ENCOUNTER — Ambulatory Visit: Payer: BLUE CROSS/BLUE SHIELD | Admitting: Family Medicine

## 2018-05-18 ENCOUNTER — Other Ambulatory Visit: Payer: Self-pay

## 2018-05-18 ENCOUNTER — Ambulatory Visit (INDEPENDENT_AMBULATORY_CARE_PROVIDER_SITE_OTHER): Payer: BLUE CROSS/BLUE SHIELD | Admitting: Family Medicine

## 2018-05-18 ENCOUNTER — Encounter: Payer: Self-pay | Admitting: Family Medicine

## 2018-05-18 VITALS — BP 112/72 | HR 88 | Temp 98.1°F | Resp 16 | Ht 61.0 in | Wt 137.8 lb

## 2018-05-18 DIAGNOSIS — E063 Autoimmune thyroiditis: Secondary | ICD-10-CM

## 2018-05-18 DIAGNOSIS — E663 Overweight: Secondary | ICD-10-CM | POA: Insufficient documentation

## 2018-05-18 DIAGNOSIS — R718 Other abnormality of red blood cells: Secondary | ICD-10-CM

## 2018-05-18 DIAGNOSIS — Z820 Family history of epilepsy and other diseases of the nervous system: Secondary | ICD-10-CM

## 2018-05-18 DIAGNOSIS — E559 Vitamin D deficiency, unspecified: Secondary | ICD-10-CM

## 2018-05-18 DIAGNOSIS — Z720 Tobacco use: Secondary | ICD-10-CM | POA: Diagnosis not present

## 2018-05-18 DIAGNOSIS — Z833 Family history of diabetes mellitus: Secondary | ICD-10-CM

## 2018-05-18 DIAGNOSIS — Z131 Encounter for screening for diabetes mellitus: Secondary | ICD-10-CM

## 2018-05-18 DIAGNOSIS — Z8249 Family history of ischemic heart disease and other diseases of the circulatory system: Secondary | ICD-10-CM

## 2018-05-18 DIAGNOSIS — Z818 Family history of other mental and behavioral disorders: Secondary | ICD-10-CM | POA: Insufficient documentation

## 2018-05-18 NOTE — Progress Notes (Signed)
Name: Tamara Walker   MRN: [id number]    DOB: 09-Apr-1985   Date:05/18/2018       Progress Note  Subjective  Chief Complaint  Chief Complaint  Patient presents with  . Hypothyroidism    medication refills    HPI  Pt presents for follow up - she used to see Dr. Sherryll Burger who has since left the practice.   Hypothyroidism:  She has been taking synthroid.  Still has consipation and occaisonal palpitation.  No diarrhea, no heat/cold intolerance.  We will recheck labs today.  She was diagnosed in 2016 and had [place] of goiter at that time that showed heterogenous thyroid, however she noted in the recent months the thyroid has enlarged further, so we will reorder [place] today.  Tobacco Abuse: She is still smoking about 3-5 cigarettes/day.  She is not ready to quit.   Overweight: She does not exercise, but does have plans to start going back to the gym.  She is not eating very healthy right now - eats a lot of fried foods, energy drinks, ginger ale, rice and meat.  She has family history of DM in her family; her Dad has MI in his 49's.  Patient Active Problem List   Diagnosis Date Noted  . Microcytic erythrocytes 03/18/2017  . Vitamin D deficiency 11/08/2016  . Well woman exam without gynecological exam 05/03/2016  . Hashimoto's thyroiditis 10/31/2015  . Thyroid disease 01/03/2015  . Thyroid nodule 01/03/2015    Past Surgical History:  Procedure Laterality Date  . CESAREAN SECTION    . EYE SURGERY    . TUBAL LIGATION  2016    Family History  Problem Relation Age of Onset  . Dementia Father 70       Died in his early 74's  . Heart attack Father 76       Died in his early 52's  . Diabetes Maternal Grandfather   . Diabetes Paternal Grandmother   . Dementia Paternal Grandmother 75  . Cancer Neg Hx   . Heart disease Neg Hx     Social History   Socioeconomic History  . Marital status: Married    Spouse name: Not on file  . Number of children: Not on file  . Years of education:  Not on file  . Highest education level: Not on file  Occupational History  . Not on file  Social Needs  . Financial resource strain: Not on file  . Food insecurity:    Worry: Not on file    Inability: Not on file  . Transportation needs:    Medical: Not on file    Non-medical: Not on file  Tobacco Use  . Smoking status: Current Every Day Smoker    Packs/day: 0.25    Years: 15.00    Pack years: 3.75    Types: Cigarettes  . Smokeless tobacco: Never Used  Substance and Sexual Activity  . Alcohol use: Yes    Alcohol/week: 0.0 standard drinks    Comment: occasional 1-2x/yr  . Drug use: No  . Sexual activity: Yes    Partners: Male    Birth control/protection: Surgical  Lifestyle  . Physical activity:    Days per week: Not on file    Minutes per session: Not on file  . Stress: Not on file  Relationships  . Social connections:    Talks on phone: Not on file    Gets together: Not on file    Attends religious service: Not on  file    Active member of club or organization: Not on file    Attends meetings of clubs or organizations: Not on file    Relationship status: Not on file  . Intimate partner violence:    Fear of current or ex partner: Not on file    Emotionally abused: Not on file    Physically abused: Not on file    Forced sexual activity: Not on file  Other Topics Concern  . Not on file  Social History Narrative  . Not on file     Current Outpatient Medications:  .  levothyroxine (SYNTHROID, LEVOTHROID) 88 MCG tablet, Take 1 tablet (88 mcg total) by mouth daily. (have labs done 8 weeks after new dose), Disp: 30 tablet, Rfl: 11  No Known Allergies  I personally reviewed active problem list, medication list, allergies, health maintenance with the patient/caregiver today.   ROS  Constitutional: Negative for fever or weight change.  Respiratory: Negative for cough and shortness of breath.   Cardiovascular: Negative for chest pain or palpitations.   Gastrointestinal: Negative for abdominal pain, constipation is ongoing.  Musculoskeletal: Negative for gait problem or joint swelling.  Skin: Negative for rash.  Neurological: Negative for dizziness or headache.  No other specific complaints in a complete review of systems (except as listed in HPI above).  Objective  Vitals:   05/18/18 1107  BP: 112/72  Pulse: 88  Resp: 16  Temp: 98.1 F (36.7 C)  TempSrc: Oral  SpO2: 99%  Weight: 137 lb 12.8 oz (62.5 kg)  Height: 5\' 1"  (1.549 m)   Body mass index is 26.04 kg/m.  Physical Exam  Constitutional: Patient appears well-developed and well-nourished. No distress.  HENT: Head: Normocephalic and atraumatic. Eyes: Conjunctivae and EOM are normal. No scleral icterus. Neck: Normal range of motion. Neck supple. No JVD present. thyromegaly present.  Cardiovascular: Normal rate, regular rhythm and normal heart sounds.  No murmur heard. No BLE edema. Pulmonary/Chest: Effort normal and breath sounds normal. No respiratory distress. Abdominal: Soft. Bowel sounds are normal, no distension. There is no tenderness. No masses. Musculoskeletal: Normal range of motion, no joint effusions. No gross deformities Neurological: Pt is alert and oriented to person, place, and time. No cranial nerve deficit. Coordination, balance, strength, speech and gait are normal.  Skin: Skin is warm and dry. No rash noted. No erythema.  Psychiatric: Patient has a normal mood and affect. behavior is normal. Judgment and thought content normal.  No results found for this or any previous visit (from the past 72 hour(s)).  PHQ2/9: Depression screen [facility] 2/9 05/18/2018 03/19/2017 03/17/2017 11/08/2016 05/03/2016  Decreased Interest 0 0 0 0 0  Down, Depressed, Hopeless 0 0 0 0 0  PHQ - 2 Score 0 0 0 0 0  Altered sleeping 1 - - - -  Tired, decreased energy 0 - - - -  Change in appetite 0 - - - -  Feeling bad or failure about yourself  0 - - - -  Trouble concentrating 0 - - - -   Moving slowly or fidgety/restless 0 - - - -  Suicidal thoughts 0 - - - -  PHQ-9 Score 1 - - - -  Difficult doing work/chores Not difficult at all - - - -   PHQ-2/9 Result is negative.    Fall Risk: Fall Risk  05/18/2018 03/19/2017 03/17/2017 11/08/2016 05/03/2016  Falls in the past year? 0 No No No No  Number falls in past yr: 0 - - - -  Injury with Fall? 0 - - - -  Follow up Falls evaluation completed - - - -   Assessment & Plan  1. Hashimoto's thyroiditis - US THYROID; Future - TSH  2. Vitamin D deficiency - VITAMIN D 25 Hydroxy (Vit-D Deficiency, Fractures)  3. Tobacco abuse - Not ready to quit  4. Overweight (BMI 25.0-29.9) - Discussed importance of 150 minutes of physical activity weekly, eat two servings of fish weekly, eat one serving of tree nuts ( cashews, pistachios, pecans, almonds.Marland Kitchen) every other day, eat 6 servings of fruit/vegetables daily and drink plenty of water and avoid sweet beverages.  - Lipid panel  5. Microcytic erythrocytes - CBC w/Diff/Platelet  6. Diabetes mellitus screening - COMPLETE METABOLIC PANEL WITH GFR  7. Family history of diabetes mellitus - COMPLETE METABOLIC PANEL WITH GFR  8. Family history of heart attack - Lipid panel  9. Paternal family history of dementia - Discussed in detail, will monitor if memory issues appear, will evaluate at that time.

## 2018-05-18 NOTE — Patient Instructions (Signed)
Please call to schedule your imaging test at [phone].  You must call to schedule, they will not call you.  

## 2018-05-19 LAB — COMPLETE METABOLIC PANEL WITH GFR
AG Ratio: 1.9 (calc) (ref 1.0–2.5)
ALKALINE PHOSPHATASE (APISO): 82 U/L (ref 31–125)
ALT: 18 U/L (ref 6–29)
AST: 17 U/L (ref 10–30)
Albumin: 4.8 g/dL (ref 3.6–5.1)
BILIRUBIN TOTAL: 0.5 mg/dL (ref 0.2–1.2)
BUN: 11 mg/dL (ref 7–25)
CO2: 27 mmol/L (ref 20–32)
Calcium: 9.8 mg/dL (ref 8.6–10.2)
Chloride: 104 mmol/L (ref 98–110)
Creat: 0.77 mg/dL (ref 0.50–1.10)
GFR, Est African American: 118 mL/min/{1.73_m2} (ref >=60)
GFR, Est Non African American: 102 mL/min/{1.73_m2} (ref >=60)
GLUCOSE: 109 mg/dL — AB (ref 65–99)
Globulin: 2.5 g/dL (calc) (ref 1.9–3.7)
Potassium: 4.4 mmol/L (ref 3.5–5.3)
SODIUM: 138 mmol/L (ref 135–146)
Total Protein: 7.3 g/dL (ref 6.1–8.1)

## 2018-05-19 LAB — LIPID PANEL
Cholesterol: 256 mg/dL — ABNORMAL HIGH (ref <200)
HDL: 68 mg/dL (ref >=50)
LDL Cholesterol (Calc): 163 mg/dL (calc) — ABNORMAL HIGH
Non-HDL Cholesterol (Calc): 188 mg/dL (calc) — ABNORMAL HIGH (ref <130)
TRIGLYCERIDES: 126 mg/dL (ref <150)
Total CHOL/HDL Ratio: 3.8 (calc) (ref <5.0)

## 2018-05-19 LAB — CBC WITH DIFFERENTIAL/PLATELET
Absolute Monocytes: 350 cells/uL (ref 200–950)
Basophils Absolute: 28 cells/uL (ref 0–200)
Basophils Relative: 0.4 %
Eosinophils Absolute: 91 cells/uL (ref 15–500)
Eosinophils Relative: 1.3 %
HEMATOCRIT: 40.4 % (ref 35.0–45.0)
Hemoglobin: 12.8 g/dL (ref 11.7–15.5)
LYMPHS ABS: 2541 {cells}/uL (ref 850–3900)
MCH: 23.5 pg — ABNORMAL LOW (ref 27.0–33.0)
MCHC: 31.7 g/dL — ABNORMAL LOW (ref 32.0–36.0)
MCV: 74.1 fL — ABNORMAL LOW (ref 80.0–100.0)
MPV: 9.3 fL (ref 7.5–12.5)
Monocytes Relative: 5 %
NEUTROS ABS: 3990 {cells}/uL (ref 1500–7800)
NEUTROS PCT: 57 %
Platelets: 315 10*3/uL (ref 140–400)
RBC: 5.45 10*6/uL — AB (ref 3.80–5.10)
RDW: 13.1 % (ref 11.0–15.0)
Total Lymphocyte: 36.3 %
WBC: 7 10*3/uL (ref 3.8–10.8)

## 2018-05-19 LAB — VITAMIN D 25 HYDROXY (VIT D DEFICIENCY, FRACTURES): Vit D, 25-Hydroxy: 16 ng/mL — ABNORMAL LOW (ref 30–100)

## 2018-05-19 LAB — TSH: TSH: 0.67 m[IU]/L

## 2018-05-20 ENCOUNTER — Encounter: Payer: Self-pay | Admitting: Family Medicine

## 2018-05-20 ENCOUNTER — Other Ambulatory Visit: Payer: Self-pay | Admitting: Family Medicine

## 2018-05-20 DIAGNOSIS — E063 Autoimmune thyroiditis: Secondary | ICD-10-CM

## 2018-05-20 DIAGNOSIS — E559 Vitamin D deficiency, unspecified: Secondary | ICD-10-CM

## 2018-05-20 DIAGNOSIS — R7309 Other abnormal glucose: Secondary | ICD-10-CM | POA: Insufficient documentation

## 2018-05-20 DIAGNOSIS — E782 Mixed hyperlipidemia: Secondary | ICD-10-CM

## 2018-05-20 MED ORDER — LEVOTHYROXINE SODIUM 88 MCG PO TABS: 88.0000 ug | ORAL_TABLET | Freq: Every day | ORAL | 1 refills | Status: DC

## 2018-05-21 ENCOUNTER — Other Ambulatory Visit: Payer: Self-pay | Admitting: Family Medicine

## 2018-05-21 DIAGNOSIS — E782 Mixed hyperlipidemia: Secondary | ICD-10-CM

## 2018-05-21 DIAGNOSIS — E785 Hyperlipidemia, unspecified: Secondary | ICD-10-CM | POA: Insufficient documentation

## 2018-05-21 DIAGNOSIS — R718 Other abnormality of red blood cells: Secondary | ICD-10-CM

## 2018-05-21 MED ORDER — ROSUVASTATIN CALCIUM 5 MG PO TABS: 5.0000 mg | ORAL_TABLET | Freq: Every day | ORAL | 3 refills | Status: DC

## 2018-05-25 ENCOUNTER — Other Ambulatory Visit: Payer: Self-pay | Admitting: Family Medicine

## 2018-05-25 ENCOUNTER — Other Ambulatory Visit: Payer: Self-pay

## 2018-05-25 ENCOUNTER — Ambulatory Visit
Admission: RE | Admit: 2018-05-25 | Discharge: 2018-05-25 | Disposition: A | Discharge status: Home / Self Care | Payer: BLUE CROSS/BLUE SHIELD | Source: Ambulatory Visit | Attending: Family Medicine | Admitting: Family Medicine

## 2018-05-25 DIAGNOSIS — E041 Nontoxic single thyroid nodule: Secondary | ICD-10-CM

## 2018-05-25 DIAGNOSIS — E063 Autoimmune thyroiditis: Secondary | ICD-10-CM | POA: Diagnosis not present

## 2018-05-29 ENCOUNTER — Encounter: Payer: BLUE CROSS/BLUE SHIELD | Admitting: Certified Nurse Midwife

## 2018-06-05 ENCOUNTER — Encounter: Payer: BLUE CROSS/BLUE SHIELD | Admitting: Certified Nurse Midwife

## 2018-06-26 ENCOUNTER — Ambulatory Visit: Payer: BLUE CROSS/BLUE SHIELD

## 2018-07-24 ENCOUNTER — Encounter: Payer: BLUE CROSS/BLUE SHIELD | Admitting: Certified Nurse Midwife

## 2018-11-20 ENCOUNTER — Ambulatory Visit: Payer: BLUE CROSS/BLUE SHIELD | Admitting: Family Medicine

## 2018-12-02 ENCOUNTER — Encounter: Payer: Self-pay | Admitting: Family Medicine

## 2018-12-02 ENCOUNTER — Other Ambulatory Visit: Payer: Self-pay

## 2018-12-02 DIAGNOSIS — E063 Autoimmune thyroiditis: Secondary | ICD-10-CM

## 2018-12-03 ENCOUNTER — Encounter: Payer: Self-pay | Admitting: Family Medicine

## 2018-12-03 MED ORDER — LEVOTHYROXINE SODIUM 88 MCG PO TABS: 88.0000 ug | ORAL_TABLET | Freq: Every day | ORAL | 0 refills | Status: DC

## 2018-12-03 NOTE — Telephone Encounter (Signed)
Please schedule patient for follow up in the next 30 days.  

## 2019-01-08 ENCOUNTER — Other Ambulatory Visit: Payer: Self-pay

## 2019-01-08 ENCOUNTER — Ambulatory Visit (INDEPENDENT_AMBULATORY_CARE_PROVIDER_SITE_OTHER): Payer: BC Managed Care – PPO | Admitting: Family Medicine

## 2019-01-08 ENCOUNTER — Encounter: Payer: Self-pay | Admitting: Family Medicine

## 2019-01-08 VITALS — BP 110/68 | HR 100 | Temp 97.8°F | Resp 16 | Ht 61.0 in | Wt 142.3 lb

## 2019-01-08 DIAGNOSIS — G4700 Insomnia, unspecified: Secondary | ICD-10-CM

## 2019-01-08 DIAGNOSIS — E559 Vitamin D deficiency, unspecified: Secondary | ICD-10-CM

## 2019-01-08 DIAGNOSIS — Z720 Tobacco use: Secondary | ICD-10-CM | POA: Diagnosis not present

## 2019-01-08 DIAGNOSIS — F419 Anxiety disorder, unspecified: Secondary | ICD-10-CM

## 2019-01-08 DIAGNOSIS — E782 Mixed hyperlipidemia: Secondary | ICD-10-CM

## 2019-01-08 DIAGNOSIS — E663 Overweight: Secondary | ICD-10-CM

## 2019-01-08 DIAGNOSIS — R718 Other abnormality of red blood cells: Secondary | ICD-10-CM

## 2019-01-08 DIAGNOSIS — E041 Nontoxic single thyroid nodule: Secondary | ICD-10-CM | POA: Diagnosis not present

## 2019-01-08 DIAGNOSIS — Z23 Encounter for immunization: Secondary | ICD-10-CM

## 2019-01-08 DIAGNOSIS — E063 Autoimmune thyroiditis: Secondary | ICD-10-CM

## 2019-01-08 DIAGNOSIS — R7309 Other abnormal glucose: Secondary | ICD-10-CM

## 2019-01-08 MED ORDER — BUSPIRONE HCL 10 MG PO TABS: ORAL_TABLET | ORAL | 1 refills | Status: DC

## 2019-01-08 NOTE — Progress Notes (Signed)
Name: Tamara Walker   MRN: [id number]    DOB: 04-Oct-1985   Date:01/08/2019       Progress Note  Subjective  Chief Complaint  Chief Complaint  Patient presents with  . Hypothyroidism    follow up, labs  . Hyperlipidemia    HPI  Hypothyroidism:  She has been taking 88mcg synthroid.  Still has consipation and occaisonal palpitation.  No diarrhea, no heat/cold intolerance.  She does have occasional "swelling" of the thyroid gland - no choking, but sometimes discomfort with swallowing - ongoing prior to her US in March.  Will start taking ibuprofen to help.  We will recheck labs today.  She was diagnosed in 2016 and had US of goiter.  She had repeat US done 05/25/2018 and has one nodule requiring annual follow up - this order is placed and discussed with her today.  Tobacco Abuse: She is still smoking about 3-5 cigarettes/day.  Cessation recommended, she is not ready to quit at this time.   Overweight: She does not exercise, thinking about returning to the gym now that they're back open.  She is not eating very healthy right now - she cut out fried foods completely since last visit when she had HLD. Body mass index is 26.89 kg/m.  HLD: Taking crestor 5mg  for HLD.  She is tolerating well.  No chest pain, myalgias.  She has family history of DM in her family; her Dad has MI in his 340's.  Vit D def: She really hates taking medications on a regular basis; she did take initial doses in March, then stopped.   Mild anemia: Will recheck labs today along with iron studies.  No blood in stool/dark and tarry stools, no heavy menses.  She has never seen hematology in the past.    Elevated glucose: will recheck today as she is fasting.  Denies polyuria, polyphagia, or polydipsia.   Insomnia and Anxiety: Ongoing for years, worse this past year as she has a lot of anxiety.  Denies panic attacks.  Denies crying spells.  No known family history of bipolar.   Patient Active Problem List   Diagnosis  Date Noted  . Hyperlipidemia 05/21/2018  . Elevated glucose 05/20/2018  . Paternal family history of dementia 05/18/2018  . Family history of diabetes mellitus 05/18/2018  . Overweight (BMI 25.0-29.9) 05/18/2018  . Tobacco abuse 05/18/2018  . Microcytic erythrocytes 03/18/2017  . Vitamin D deficiency 11/08/2016  . Well woman exam without gynecological exam 05/03/2016  . Hashimoto's thyroiditis 10/31/2015  . Thyroid disease 01/03/2015  . Thyroid nodule 01/03/2015    Past Surgical History:  Procedure Laterality Date  . CESAREAN SECTION    . EYE SURGERY    . TUBAL LIGATION  2016    Family History  Problem Relation Age of Onset  . Dementia Father 4240       Died in his early 1750's  . Heart attack Father 8240       Died in his early 4550's  . Diabetes Maternal Grandfather   . Diabetes Paternal Grandmother   . Dementia Paternal Grandmother 3940  . Cancer Neg Hx   . Heart disease Neg Hx     Social History   Socioeconomic History  . Marital status: Married    Spouse name: Not on file  . Number of children: Not on file  . Years of education: Not on file  . Highest education level: Not on file  Occupational History  . Not on file  Social  Needs  . Financial resource strain: Not on file  . Food insecurity    Worry: Not on file    Inability: Not on file  . Transportation needs    Medical: Not on file    Non-medical: Not on file  Tobacco Use  . Smoking status: Current Every Day Smoker    Packs/day: 0.25    Years: 15.00    Pack years: 3.75    Types: Cigarettes  . Smokeless tobacco: Never Used  Substance and Sexual Activity  . Alcohol use: Yes    Alcohol/week: 0.0 standard drinks    Comment: occasional 1-2x/yr  . Drug use: No  . Sexual activity: Yes    Partners: Male    Birth control/protection: Surgical  Lifestyle  . Physical activity    Days per week: Not on file    Minutes per session: Not on file  . Stress: Not on file  Relationships  . Social Wellsite geologist on phone: Not on file    Gets together: Not on file    Attends religious service: Not on file    Active member of club or organization: Not on file    Attends meetings of clubs or organizations: Not on file    Relationship status: Not on file  . Intimate partner violence    Fear of current or ex partner: Not on file    Emotionally abused: Not on file    Physically abused: Not on file    Forced sexual activity: Not on file  Other Topics Concern  . Not on file  Social History Narrative  . Not on file     Current Outpatient Medications:  .  levothyroxine (SYNTHROID) 88 MCG tablet, Take 1 tablet (88 mcg total) by mouth daily. (have labs done 8 weeks after new dose), Disp: 90 tablet, Rfl: 0 .  rosuvastatin (CRESTOR) 5 MG tablet, Take 1 tablet (5 mg total) by mouth daily., Disp: 90 tablet, Rfl: 3  No Known Allergies  I personally reviewed active problem list, medication list, allergies, health maintenance, notes from last encounter, lab results with the patient/caregiver today.   ROS  Constitutional: Negative for fever or weight change.  Respiratory: Negative for cough and shortness of breath.   Cardiovascular: Negative for chest pain or palpitations.  Gastrointestinal: Negative for abdominal pain, no bowel changes.  Musculoskeletal: Negative for gait problem or joint swelling.  Skin: Negative for rash.  Neurological: Negative for dizziness or headache.  No other specific complaints in a complete review of systems (except as listed in HPI above).  Objective  Vitals:   01/08/19 1030  BP: 110/68  Pulse: 100  Resp: 16  Temp: 97.8 F (36.6 C)  TempSrc: Temporal  SpO2: 97%  Weight: 142 lb 4.8 oz (64.5 kg)  Height: 5\' 1"  (1.549 m)    Body mass index is 26.89 kg/m.  Physical Exam  Constitutional: Patient appears well-developed and well-nourished. No distress.  HENT: Head: Normocephalic and atraumatic. Eyes: Conjunctivae and EOM are normal. No scleral icterus.   Neck: Normal range of motion. Neck supple. No JVD present. Thyroid is enlarged - R>L, nodule is palpable on the medial right side. Cardiovascular: Normal rate, regular rhythm and normal heart sounds.  No murmur heard. No BLE edema. Pulmonary/Chest: Effort normal and breath sounds normal. No respiratory distress. Musculoskeletal: Normal range of motion, no joint effusions. No gross deformities Neurological: Pt is alert and oriented to person, place, and time. No cranial nerve deficit. Coordination, balance,  strength, speech and gait are normal.  Skin: Skin is warm and dry. No rash noted. No erythema.  Psychiatric: Patient has a normal mood and affect. behavior is normal. Judgment and thought content normal.  No results found for this or any previous visit (from the past 72 hour(s)).   PHQ2/9: Depression screen [facility] 2/9 01/08/2019 05/18/2018 03/19/2017 03/17/2017 11/08/2016  Decreased Interest 0 0 0 0 0  Down, Depressed, Hopeless 0 0 0 0 0  PHQ - 2 Score 0 0 0 0 0  Altered sleeping 0 1 - - -  Tired, decreased energy 0 0 - - -  Change in appetite 0 0 - - -  Feeling bad or failure about yourself  0 0 - - -  Trouble concentrating 0 0 - - -  Moving slowly or fidgety/restless 0 0 - - -  Suicidal thoughts 0 0 - - -  PHQ-9 Score 0 1 - - -  Difficult doing work/chores Not difficult at all Not difficult at all - - -   PHQ-2/9 Result is negative.    Fall Risk: Fall Risk  01/08/2019 05/18/2018 03/19/2017 03/17/2017 11/08/2016  Falls in the past year? 0 0 No No No  Number falls in past yr: 0 0 - - -  Injury with Fall? 0 0 - - -  Follow up Falls evaluation completed Falls evaluation completed - - -   Assessment & Plan  1. Thyroid nodule - TSH  2. Hashimoto's thyroiditis - Ibuprofen as needed - 600mg  every 8 hours as needed for inflammation. Will call if becoming more consistent or if choking episodes.  - TSH  3. Tobacco abuse - Not ready to quit  4. Overweight (BMI 25.0-29.9) - Discussed  importance of 150 minutes of physical activity weekly, eat two servings of fish weekly, eat one serving of tree nuts ( cashews, pistachios, pecans, almonds.Marland Kitchen) every other day, eat 6 servings of fruit/vegetables daily and drink plenty of water and avoid sweet beverages.  - Lipid panel  5. Elevated glucose - Recheck today; A1C is still elevated as she is fasting - BMP w/ GFR - [facility]  6. Mixed hyperlipidemia - Taking crestor and doing well; recheck labs today - Lipid panel  7. Vitamin D deficiency - Needs to restart supplement - 5000IU weekly.  8. Microcytic erythrocytes - CBC with Differential/Platelet - Iron, TIBC and Ferritin Panel - Consider Hematology based on labs  9. Needs flu shot - Flu Vaccine QUAD 6+ mos PF IM (Fluarix Quad PF)  10. Anxiety - busPIRone (BUSPAR) 10 MG tablet; Take 1/2 tablet twice daily for 2 weeks, then increase to 1 tablet twice daily.  Dispense: 180 tablet; Refill: 1  11. Insomnia, unspecified type - busPIRone (BUSPAR) 10 MG tablet; Take 1/2 tablet twice daily for 2 weeks, then increase to 1 tablet twice daily.  Dispense: 180 tablet; Refill: 1

## 2019-01-08 NOTE — Patient Instructions (Signed)
Take 5000IU vitamin D OTC once weekly throughout the winter.

## 2019-01-09 LAB — CBC WITH DIFFERENTIAL/PLATELET
Absolute Monocytes: 417 cells/uL (ref 200–950)
Basophils Absolute: 34 cells/uL (ref 0–200)
Basophils Relative: 0.4 %
Eosinophils Absolute: 68 cells/uL (ref 15–500)
Eosinophils Relative: 0.8 %
HCT: 40.2 % (ref 35.0–45.0)
Hemoglobin: 12 g/dL (ref 11.7–15.5)
Lymphs Abs: 2839 cells/uL (ref 850–3900)
MCH: 22.7 pg — ABNORMAL LOW (ref 27.0–33.0)
MCHC: 29.9 g/dL — ABNORMAL LOW (ref 32.0–36.0)
MCV: 76.1 fL — ABNORMAL LOW (ref 80.0–100.0)
MPV: 9.7 fL (ref 7.5–12.5)
Monocytes Relative: 4.9 %
Neutro Abs: 5143 cells/uL (ref 1500–7800)
Neutrophils Relative %: 60.5 %
Platelets: 265 10*3/uL (ref 140–400)
RBC: 5.28 10*6/uL — ABNORMAL HIGH (ref 3.80–5.10)
RDW: 13.1 % (ref 11.0–15.0)
Total Lymphocyte: 33.4 %
WBC: 8.5 10*3/uL (ref 3.8–10.8)

## 2019-01-09 LAB — BASIC METABOLIC PANEL WITH GFR
BUN/Creatinine Ratio: 8 (calc) (ref 6–22)
BUN: 6 mg/dL — ABNORMAL LOW (ref 7–25)
CO2: 27 mmol/L (ref 20–32)
Calcium: 9.4 mg/dL (ref 8.6–10.2)
Chloride: 106 mmol/L (ref 98–110)
Creat: 0.79 mg/dL (ref 0.50–1.10)
GFR, Est African American: 114 mL/min/{1.73_m2} (ref >=60)
GFR, Est Non African American: 98 mL/min/{1.73_m2} (ref >=60)
Glucose, Bld: 93 mg/dL (ref 65–99)
Potassium: 3.9 mmol/L (ref 3.5–5.3)
Sodium: 140 mmol/L (ref 135–146)

## 2019-01-09 LAB — TSH: TSH: 1.76 mIU/L

## 2019-01-09 LAB — LIPID PANEL
Cholesterol: 138 mg/dL (ref <200)
HDL: 58 mg/dL (ref >=50)
LDL Cholesterol (Calc): 62 mg/dL (calc)
Non-HDL Cholesterol (Calc): 80 mg/dL (calc) (ref <130)
Total CHOL/HDL Ratio: 2.4 (calc) (ref <5.0)
Triglycerides: 95 mg/dL (ref <150)

## 2019-01-09 LAB — IRON,TIBC AND FERRITIN PANEL
%SAT: 32 % (calc) (ref 16–45)
Ferritin: 49 ng/mL (ref 16–154)
Iron: 96 ug/dL (ref 40–190)
TIBC: 299 mcg/dL (calc) (ref 250–450)

## 2019-01-12 ENCOUNTER — Other Ambulatory Visit: Payer: Self-pay | Admitting: Family Medicine

## 2019-01-12 DIAGNOSIS — R718 Other abnormality of red blood cells: Secondary | ICD-10-CM

## 2019-01-14 ENCOUNTER — Other Ambulatory Visit: Payer: Self-pay

## 2019-01-15 ENCOUNTER — Inpatient Hospital Stay: Payer: BC Managed Care – PPO | Attending: Oncology | Admitting: Oncology

## 2019-01-15 ENCOUNTER — Encounter: Payer: Self-pay | Admitting: Oncology

## 2019-01-15 ENCOUNTER — Inpatient Hospital Stay: Payer: BC Managed Care – PPO

## 2019-01-15 ENCOUNTER — Other Ambulatory Visit: Payer: Self-pay

## 2019-01-15 VITALS — BP 111/76 | HR 62 | Temp 95.8°F | Resp 18 | Ht 61.0 in | Wt 144.2 lb

## 2019-01-15 DIAGNOSIS — R718 Other abnormality of red blood cells: Secondary | ICD-10-CM

## 2019-01-15 DIAGNOSIS — F17210 Nicotine dependence, cigarettes, uncomplicated: Secondary | ICD-10-CM | POA: Diagnosis not present

## 2019-01-15 DIAGNOSIS — E039 Hypothyroidism, unspecified: Secondary | ICD-10-CM | POA: Insufficient documentation

## 2019-01-15 DIAGNOSIS — D509 Iron deficiency anemia, unspecified: Secondary | ICD-10-CM

## 2019-01-15 DIAGNOSIS — E063 Autoimmune thyroiditis: Secondary | ICD-10-CM

## 2019-01-15 LAB — HEPATIC FUNCTION PANEL
ALT: 14 U/L (ref 0–44)
AST: 17 U/L (ref 15–41)
Albumin: 4.4 g/dL (ref 3.5–5.0)
Alkaline Phosphatase: 77 U/L (ref 38–126)
Bilirubin, Direct: <0.1 mg/dL (ref 0.0–0.2)
Total Bilirubin: 0.8 mg/dL (ref 0.3–1.2)
Total Protein: 7.5 g/dL (ref 6.5–8.1)

## 2019-01-15 LAB — CBC WITH DIFFERENTIAL/PLATELET
Abs Immature Granulocytes: 0.02 10*3/uL (ref 0.00–0.07)
Basophils Absolute: 0 10*3/uL (ref 0.0–0.1)
Basophils Relative: 0 %
Eosinophils Absolute: 0.1 10*3/uL (ref 0.0–0.5)
Eosinophils Relative: 1 %
HCT: 38.9 % (ref 36.0–46.0)
Hemoglobin: 11.8 g/dL — ABNORMAL LOW (ref 12.0–15.0)
Immature Granulocytes: 0 %
Lymphocytes Relative: 35 %
Lymphs Abs: 3 10*3/uL (ref 0.7–4.0)
MCH: 23 pg — ABNORMAL LOW (ref 26.0–34.0)
MCHC: 30.3 g/dL (ref 30.0–36.0)
MCV: 75.7 fL — ABNORMAL LOW (ref 80.0–100.0)
Monocytes Absolute: 0.5 10*3/uL (ref 0.1–1.0)
Monocytes Relative: 5 %
Neutro Abs: 5 10*3/uL (ref 1.7–7.7)
Neutrophils Relative %: 59 %
Platelets: 238 10*3/uL (ref 150–400)
RBC: 5.14 MIL/uL — ABNORMAL HIGH (ref 3.87–5.11)
RDW: 13.9 % (ref 11.5–15.5)
WBC: 8.6 10*3/uL (ref 4.0–10.5)
nRBC: 0 % (ref 0.0–0.2)

## 2019-01-15 LAB — TECHNOLOGIST SMEAR REVIEW: Plt Morphology: ADEQUATE

## 2019-01-15 NOTE — Progress Notes (Signed)
Patient here to establish care. Referred by Dr. Uvaldo Rising for microcytic erythocytes.

## 2019-01-16 NOTE — Progress Notes (Signed)
Hematology/Oncology Consult note [facility] Telephone:([phone]) [phone] [phone]) [phone]   Patient Care Team: Doren CustardBoyce, Emily E, FNP as PCP - General (Family Medicine)  REFERRING PROVIDER: Doren CustardBoyce, Emily E, FNP  CHIEF COMPLAINTS/REASON FOR VISIT:  Evaluation of microcytic anmeia  HISTORY OF PRESENTING ILLNESS:   Tamara Walker is a  33 y.o.  female with PMH listed below was seen in consultation at the request of  Doren CustardBoyce, Emily E, FNP  for evaluation of microcytic anemia.  Patient was recently seen by primary care provider. Noticed to have chronic microcytic anemia.  Was referred to heme-onc for further evaluation. Patient denies any symptoms.  She feels well today.  Patient had blood work done on 01/08/2019, hemoglobin 12, MCV 76.1, platelet count of 265,000.  WBC 8.5. Reviewed patient's previous blood work, microcytosis is chronic since at least 2018. Iron panel was obtained on 01/08/2019.  Iron saturation 32, ferritin 49, TIBC 299. Denies hematochezia, hematuria, hematemesis, epistaxis, black tarry stool or easy bruising.    Review of Systems  Constitutional: Negative for appetite change, chills, fatigue and fever.  HENT:   Negative for hearing loss and voice change.   Eyes: Negative for eye problems.  Respiratory: Negative for chest tightness and cough.   Cardiovascular: Negative for chest pain.  Gastrointestinal: Negative for abdominal distention, abdominal pain and blood in stool.  Endocrine: Negative for hot flashes.  Genitourinary: Negative for difficulty urinating and frequency.   Musculoskeletal: Negative for arthralgias.  Skin: Negative for itching and rash.  Neurological: Negative for extremity weakness.  Hematological: Negative for adenopathy.  Psychiatric/Behavioral: Negative for confusion.    MEDICAL HISTORY:  Past Medical History:  Diagnosis Date  . Thyroid disease   . Vitamin D deficiency     SURGICAL HISTORY: Past Surgical History:   Procedure Laterality Date  . CESAREAN SECTION    . EYE SURGERY    . TUBAL LIGATION  2016    SOCIAL HISTORY: Social History   Socioeconomic History  . Marital status: Married    Spouse name: Not on file  . Number of children: Not on file  . Years of education: Not on file  . Highest education level: Not on file  Occupational History  . Not on file  Social Needs  . Financial resource strain: Not on file  . Food insecurity    Worry: Not on file    Inability: Not on file  . Transportation needs    Medical: Not on file    Non-medical: Not on file  Tobacco Use  . Smoking status: Current Every Day Smoker    Packs/day: 0.25    Years: 15.00    Pack years: 3.75    Types: Cigarettes  . Smokeless tobacco: Never Used  . Tobacco comment: 3-7 cigars a day  Substance and Sexual Activity  . Alcohol use: Yes    Alcohol/week: 0.0 standard drinks    Comment: occasional 1-2x/yr  . Drug use: No  . Sexual activity: Yes    Partners: Male    Birth control/protection: Surgical  Lifestyle  . Physical activity    Days per week: Not on file    Minutes per session: Not on file  . Stress: Not on file  Relationships  . Social Musicianconnections    Talks on phone: Not on file    Gets together: Not on file    Attends religious service: Not on file    Active member of club or organization: Not on file    Attends meetings  of clubs or organizations: Not on file    Relationship status: Not on file  . Intimate partner violence    Fear of current or ex partner: Not on file    Emotionally abused: Not on file    Physically abused: Not on file    Forced sexual activity: Not on file  Other Topics Concern  . Not on file  Social History Narrative  . Not on file    FAMILY HISTORY: Family History  Problem Relation Age of Onset  . Dementia Father 17       Died in his early 4's  . Heart attack Father 48       Died in his early 30's  . Diabetes Maternal Grandfather   . Diabetes Paternal Grandmother    . Dementia Paternal Grandmother 94  . Breast cancer Maternal Aunt   . Cancer Neg Hx   . Heart disease Neg Hx     ALLERGIES:  has No Known Allergies.  MEDICATIONS:  Current Outpatient Medications  Medication Sig Dispense Refill  . busPIRone (BUSPAR) 10 MG tablet Take 1/2 tablet twice daily for 2 weeks, then increase to 1 tablet twice daily. 180 tablet 1  . levothyroxine (SYNTHROID) 88 MCG tablet Take 1 tablet (88 mcg total) by mouth daily. (have labs done 8 weeks after new dose) 90 tablet 0  . rosuvastatin (CRESTOR) 5 MG tablet Take 1 tablet (5 mg total) by mouth daily. 90 tablet 3   No current facility-administered medications for this visit.      PHYSICAL EXAMINATION: ECOG PERFORMANCE STATUS: 0 - Asymptomatic Vitals:   01/15/19 1508  BP: 111/76  Pulse: 62  Resp: 18  Temp: (!) 95.8 F (35.4 C)   Filed Weights   01/15/19 1508  Weight: 144 lb 3.2 oz (65.4 kg)    Physical Exam Constitutional:      General: She is not in acute distress. HENT:     Head: Normocephalic and atraumatic.  Eyes:     General: No scleral icterus.    Pupils: Pupils are equal, round, and reactive to light.  Neck:     Musculoskeletal: Normal range of motion and neck supple.  Cardiovascular:     Rate and Rhythm: Normal rate and regular rhythm.     Heart sounds: Normal heart sounds.  Pulmonary:     Effort: Pulmonary effort is normal. No respiratory distress.     Breath sounds: No wheezing.  Abdominal:     General: Bowel sounds are normal. There is no distension.     Palpations: Abdomen is soft. There is no mass.     Tenderness: There is no abdominal tenderness.  Musculoskeletal: Normal range of motion.        General: No deformity.  Skin:    General: Skin is warm and dry.     Findings: No erythema or rash.  Neurological:     Mental Status: She is alert and oriented to person, place, and time.     Cranial Nerves: No cranial nerve deficit.     Coordination: Coordination normal.   Psychiatric:        Behavior: Behavior normal.        Thought Content: Thought content normal.     LABORATORY DATA:  I have reviewed the data as listed Lab Results  Component Value Date   WBC 8.6 01/15/2019   HGB 11.8 (L) 01/15/2019   HCT 38.9 01/15/2019   MCV 75.7 (L) 01/15/2019   PLT 238 01/15/2019  Recent Labs    05/18/18 1140 01/08/19 0000 01/15/19 1545  NA 138 140  --   K 4.4 3.9  --   CL 104 106  --   CO2 27 27  --   GLUCOSE 109* 93  --   BUN 11 6*  --   CREATININE 0.77 0.79  --   CALCIUM 9.8 9.4  --   GFRNONAA 102 98  --   GFRAA 118 114  --   PROT 7.3  --  7.5  ALBUMIN  --   --  4.4  AST 17  --  17  ALT 18  --  14  ALKPHOS  --   --  77  BILITOT 0.5  --  0.8  BILIDIR  --   --  <0.1  IBILI  --   --  NOT CALCULATED   Iron/TIBC/Ferritin/ %Sat    Component Value Date/Time   IRON 96 01/08/2019 0000   TIBC 299 01/08/2019 0000   FERRITIN 49 01/08/2019 0000   IRONPCTSAT 32 01/08/2019 0000      RADIOGRAPHIC STUDIES: I have personally reviewed the radiological images as listed and agreed with the findings in the report. No results found.    ASSESSMENT & PLAN:  1. Microcytic anemia   2. Hashimoto's thyroiditis   3. Acquired hypothyroidism    Labs are reviewed and discussed with patient. Patient is asymptomatic.  Chronic microcytosis.  Normal RDW.iron panel is not consistent with iron deficiency.   Suspect hemoglobinopathy. I will obtain CBC, smear, LFT, hemoglobinopathy evaluation. Hashimoto thyroiditis and acquired hypothyroidism.  Patient is on levothyroxine.  TSH was last monitored on  01/08/2019 which was within normal range.  Continue follow-up with primary care provider and endocrinologist.  Patient can follow-up in 3 weeks to discuss blood results. Orders Placed This Encounter  Procedures  . Hemoglobinopathy evaluation    Standing Status:   Future    Number of Occurrences:   1    Standing Expiration Date:   01/15/2020  . CBC with  Differential/Platelet    Standing Status:   Future    Number of Occurrences:   1    Standing Expiration Date:   01/15/2020  . Technologist smear review    Standing Status:   Future    Number of Occurrences:   1    Standing Expiration Date:   01/15/2020  . Hepatic function panel    Standing Status:   Future    Number of Occurrences:   1    Standing Expiration Date:   01/15/2020    All questions were answered. The patient knows to call the clinic with any problems questions or concerns.  cc Hubbard Hartshorn, FNP    Return of visit: 3 weeks.  Thank you for this kind referral and the opportunity to participate in the care of this patient. A copy of today's note is routed to referring provider  We spent sufficient time to discuss many aspect of care, questions were answered to patient's satisfaction. Total face to face encounter time for this patient visit was 45 min. >50% of the time was  spent in counseling and coordination of care.     Earlie Server, MD, PhD Hematology Oncology [facility] Pager- [phone] 01/16/2019

## 2019-01-19 LAB — HEMOGLOBINOPATHY EVALUATION
Hgb A2 Quant: 2.1 % (ref 1.8–3.2)
Hgb A: 97.9 % (ref 96.4–98.8)
Hgb C: 0 %
Hgb F Quant: 0 % (ref 0.0–2.0)
Hgb S Quant: 0 %
Hgb Variant: 0 %

## 2019-02-02 ENCOUNTER — Telehealth: Payer: Self-pay | Admitting: *Deleted

## 2019-02-02 NOTE — Telephone Encounter (Signed)
Per pt request to cancel her sched 02/05/19 MD Only visit. Pt stated that she will call back at a later date to R/S

## 2019-02-05 ENCOUNTER — Ambulatory Visit: Payer: BC Managed Care – PPO | Admitting: Oncology

## 2019-02-12 ENCOUNTER — Encounter: Payer: Self-pay | Admitting: Family Medicine

## 2019-02-12 ENCOUNTER — Ambulatory Visit (INDEPENDENT_AMBULATORY_CARE_PROVIDER_SITE_OTHER): Payer: BC Managed Care – PPO | Admitting: Family Medicine

## 2019-02-12 ENCOUNTER — Other Ambulatory Visit: Payer: Self-pay

## 2019-02-12 ENCOUNTER — Other Ambulatory Visit (HOSPITAL_COMMUNITY)
Admission: RE | Admit: 2019-02-12 | Discharge: 2019-02-12 | Disposition: A | Discharge status: Home / Self Care | Payer: BC Managed Care – PPO | Source: Ambulatory Visit | Attending: Family Medicine | Admitting: Family Medicine

## 2019-02-12 VITALS — BP 112/72 | HR 71 | Temp 97.9°F | Resp 14 | Ht 61.0 in | Wt 141.7 lb

## 2019-02-12 DIAGNOSIS — Z124 Encounter for screening for malignant neoplasm of cervix: Secondary | ICD-10-CM | POA: Diagnosis present

## 2019-02-12 DIAGNOSIS — Z01419 Encounter for gynecological examination (general) (routine) without abnormal findings: Secondary | ICD-10-CM

## 2019-02-12 DIAGNOSIS — F331 Major depressive disorder, recurrent, moderate: Secondary | ICD-10-CM

## 2019-02-12 DIAGNOSIS — G4700 Insomnia, unspecified: Secondary | ICD-10-CM

## 2019-02-12 DIAGNOSIS — K5900 Constipation, unspecified: Secondary | ICD-10-CM

## 2019-02-12 DIAGNOSIS — F419 Anxiety disorder, unspecified: Secondary | ICD-10-CM

## 2019-02-12 MED ORDER — HYDROXYZINE PAMOATE 25 MG PO CAPS: 25.0000 mg | ORAL_CAPSULE | Freq: Three times a day (TID) | ORAL | 0 refills | Status: DC | PRN

## 2019-02-12 MED ORDER — SERTRALINE HCL 25 MG PO TABS: ORAL_TABLET | ORAL | 1 refills | Status: DC

## 2019-02-12 MED ORDER — DOCUSATE SODIUM 100 MG PO CAPS: 100.0000 mg | ORAL_CAPSULE | Freq: Two times a day (BID) | ORAL | 0 refills | Status: DC | PRN

## 2019-02-12 NOTE — Patient Instructions (Signed)
Preventive Care 21-33 Years Old, Female Preventive care refers to visits with your health care provider and lifestyle choices that can promote health and wellness. This includes:  A yearly physical exam. This may also be called an annual well check.  Regular dental visits and eye exams.  Immunizations.  Screening for certain conditions.  Healthy lifestyle choices, such as eating a healthy diet, getting regular exercise, not using drugs or products that contain nicotine and tobacco, and limiting alcohol use. What can I expect for my preventive care visit? Physical exam Your health care provider will check your:  Height and weight. This may be used to calculate body mass index (BMI), which tells if you are at a healthy weight.  Heart rate and blood pressure.  Skin for abnormal spots. Counseling Your health care provider may ask you questions about your:  Alcohol, tobacco, and drug use.  Emotional well-being.  Home and relationship well-being.  Sexual activity.  Eating habits.  Work and work environment.  Method of birth control.  Menstrual cycle.  Pregnancy history. What immunizations do I need?  Influenza (flu) vaccine  This is recommended every year. Tetanus, diphtheria, and pertussis (Tdap) vaccine  You may need a Td booster every 10 years. Varicella (chickenpox) vaccine  You may need this if you have not been vaccinated. Human papillomavirus (HPV) vaccine  If recommended by your health care provider, you may need three doses over 6 months. Measles, mumps, and rubella (MMR) vaccine  You may need at least one dose of MMR. You may also need a second dose. Meningococcal conjugate (MenACWY) vaccine  One dose is recommended if you are age 19-21 years and a first-year college student living in a residence hall, or if you have one of several medical conditions. You may also need additional booster doses. Pneumococcal conjugate (PCV13) vaccine  You may need  this if you have certain conditions and were not previously vaccinated. Pneumococcal polysaccharide (PPSV23) vaccine  You may need one or two doses if you smoke cigarettes or if you have certain conditions. Hepatitis A vaccine  You may need this if you have certain conditions or if you travel or work in places where you may be exposed to hepatitis A. Hepatitis B vaccine  You may need this if you have certain conditions or if you travel or work in places where you may be exposed to hepatitis B. Haemophilus influenzae type b (Hib) vaccine  You may need this if you have certain conditions. You may receive vaccines as individual doses or as more than one vaccine together in one shot (combination vaccines). Talk with your health care provider about the risks and benefits of combination vaccines. What tests do I need?  Blood tests  Lipid and cholesterol levels. These may be checked every 5 years starting at age 20.  Hepatitis C test.  Hepatitis B test. Screening  Diabetes screening. This is done by checking your blood sugar (glucose) after you have not eaten for a while (fasting).  Sexually transmitted disease (STD) testing.  BRCA-related cancer screening. This may be done if you have a family history of breast, ovarian, tubal, or peritoneal cancers.  Pelvic exam and Pap test. This may be done every 3 years starting at age 21. Starting at age 30, this may be done every 5 years if you have a Pap test in combination with an HPV test. Talk with your health care provider about your test results, treatment options, and if necessary, the need for more tests.   Follow these instructions at home: Eating and drinking   Eat a diet that includes fresh fruits and vegetables, whole grains, lean protein, and low-fat dairy.  Take vitamin and mineral supplements as recommended by your health care provider.  Do not drink alcohol if: ? Your health care provider tells you not to drink. ? You are  pregnant, may be pregnant, or are planning to become pregnant.  If you drink alcohol: ? Limit how much you have to 0-1 drink a day. ? Be aware of how much alcohol is in your drink. In the [place] one drink equals one 12 oz bottle of beer (355 mL), one 5 oz glass of wine (148 mL), or one 1 oz glass of hard liquor (44 mL). Lifestyle  Take daily care of your teeth and gums.  Stay active. Exercise for at least 30 minutes on 5 or more days each week.  Do not use any products that contain nicotine or tobacco, such as cigarettes, e-cigarettes, and chewing tobacco. If you need help quitting, ask your health care provider.  If you are sexually active, practice safe sex. Use a condom or other form of birth control (contraception) in order to prevent pregnancy and STIs (sexually transmitted infections). If you plan to become pregnant, see your health care provider for a preconception visit. What's next?  Visit your health care provider once a year for a well check visit.  Ask your health care provider how often you should have your eyes and teeth checked.  Stay up to date on all vaccines. This information is not intended to replace advice given to you by your health care provider. Make sure you discuss any questions you have with your health care provider. Document Released: 04/16/2001 Document Revised: 10/30/2017 Document Reviewed: 10/30/2017 Elsevier Patient Education  2020 [employer].

## 2019-02-12 NOTE — Progress Notes (Signed)
Name: Tamara Walker   MRN: [id number]    DOB: [birth year]   Date:02/12/2019       Progress Note  Subjective  Chief Complaint  Chief Complaint  Patient presents with  . Annual Exam    HPI  Patient presents for annual CPE.  Diet: Feels like she binge eats - has low appetite most of the day, then has a large meal in the middle of the day or at nighttime. Exercise: Not exercising - discussed exercise in detail.   USPSTF grade A and B recommendations    Office Visit from 02/12/2019 in [facility]  AUDIT-C Score  1    - Occasionally   Depression: Phq 9 is  Positive. Buspar did not work well for her - she did not like the way it made her feel - nausea, headache.  Took zoloft in the past and it worked okay for her. We will also provide Hydroxyzine PRN for anxiety/insomnia related to anxiety.  Depression screen [facility] 2/9 02/12/2019 01/08/2019 05/18/2018 03/19/2017 03/17/2017  Decreased Interest 0 0 0 0 0  Down, Depressed, Hopeless 0 0 0 0 0  PHQ - 2 Score 0 0 0 0 0  Altered sleeping 3 0 1 - -  Tired, decreased energy 3 0 0 - -  Change in appetite 3 0 0 - -  Feeling bad or failure about yourself  0 0 0 - -  Trouble concentrating 0 0 0 - -  Moving slowly or fidgety/restless 2 0 0 - -  Suicidal thoughts 0 0 0 - -  PHQ-9 Score 11 0 1 - -  Difficult doing work/chores Not difficult at all Not difficult at all Not difficult at all - -   Hypertension: BP Readings from Last 3 Encounters:  02/12/19 112/72  01/15/19 111/76  01/08/19 110/68   Obesity: Wt Readings from Last 3 Encounters:  02/12/19 141 lb 11.2 oz (64.3 kg)  01/15/19 144 lb 3.2 oz (65.4 kg)  01/08/19 142 lb 4.8 oz (64.5 kg)   BMI Readings from Last 3 Encounters:  02/12/19 26.77 kg/m  01/15/19 27.25 kg/m  01/08/19 26.89 kg/m    Hep C Screening: We will check when labs are due again - just had labs done a few weeks ago. STD testing and prevention (HIV/chl/gon/syphilis): Negative in 2018. Declines  additional screening; no new partners in the last year.  Intimate partner violence:  No concerns Sexual History/Pain during Intercourse: No concerns Menstrual History/LMP/Abnormal Bleeding:  No concerns; periods are monthly Incontinence Symptoms: She has some numbness where she doesn't realize that she urinated a bit.  Started after having C-section with her son who is now 40yo.  Declines urology and/or pelvic floor PT referrals, she declines today.   Breast cancer:  - Last Mammogram: N/A - BRCA gene screening: Maternal Great Aunt with history; no other family.  Will start mammogram with screening 35-40, then annual at 33yo.  Osteoporosis: discussed high calcium and vitamin D supplementation.    Cervical cancer screening: Due today; has remote history abnormal pap.   Skin cancer: discussed atypical lesion monitoring; no concerning lesions today Colorectal cancer: Denies family or personal history of colorectal cancer, no changes in BM's - no blood in stool, dark and tarry stool, mucus in stool, or diarrhea.  Has been dealing with constipation since having her son 4 years ago - will go 2-4 BM's a month - states her family has history of constipation.   I recommend GI referral, but she will  call back if she wants to proceed. Lung cancer:  Still smoking about 1/4ppd.  Cessation recommended Low Dose CT Chest recommended if Age 64-80 years, 30 pack-year currently smoking OR have quit w/in 15years. Patient does not qualify.   ECG: EKG on file, no chest pain, shortness of breath, or palpitations.   Advanced Care Planning: A voluntary discussion about advance care planning including the explanation and discussion of advance directives.  Discussed health care proxy and Living will, and the patient was able to identify a health care proxy as Husband Parris Signer).  Patient does not have a living will at present time. If patient does have living will, I have requested they bring this to the clinic to be  scanned in to their chart.  Lipids: Lab Results  Component Value Date   CHOL 138 01/08/2019   CHOL 256 (H) 05/18/2018   CHOL 188 05/03/2016   Lab Results  Component Value Date   HDL 58 01/08/2019   HDL 68 05/18/2018   HDL 48 (L) 05/03/2016   Lab Results  Component Value Date   LDLCALC 62 01/08/2019   LDLCALC 163 (H) 05/18/2018   LDLCALC 112 (H) 05/03/2016   Lab Results  Component Value Date   TRIG 95 01/08/2019   TRIG 126 05/18/2018   TRIG 138 05/03/2016   Lab Results  Component Value Date   CHOLHDL 2.4 01/08/2019   CHOLHDL 3.8 05/18/2018   CHOLHDL 3.9 05/03/2016   No results found for: LDLDIRECT  Glucose: Glucose, Bld  Date Value Ref Range Status  01/08/2019 93 65 - 99 mg/dL Final    Comment:    .            Fasting reference interval .   05/18/2018 109 (H) 65 - 99 mg/dL Final    Comment:    .            Fasting reference interval . For someone without known diabetes, a glucose value between 100 and 125 mg/dL is consistent with prediabetes and should be confirmed with a follow-up test. .   03/17/2017 97 65 - 99 mg/dL Final    Comment:    .            Fasting reference interval .     Patient Active Problem List   Diagnosis Date Noted  . Mixed hyperlipidemia 05/21/2018  . Elevated glucose 05/20/2018  . Paternal family history of dementia 05/18/2018  . Family history of diabetes mellitus 05/18/2018  . Overweight (BMI 25.0-29.9) 05/18/2018  . Tobacco abuse 05/18/2018  . Microcytic erythrocytes 03/18/2017  . Vitamin D deficiency 11/08/2016  . Well woman exam without gynecological exam 05/03/2016  . Hashimoto's thyroiditis 10/31/2015  . Thyroid disease 01/03/2015  . Thyroid nodule 01/03/2015    Past Surgical History:  Procedure Laterality Date  . CESAREAN SECTION    . EYE SURGERY    . TUBAL LIGATION  2016    Family History  Problem Relation Age of Onset  . Dementia Father 98       Died in his early 64's  . Heart attack Father 9        Died in his early 55's  . Diabetes Maternal Grandfather   . Diabetes Paternal Grandmother   . Dementia Paternal Grandmother 60  . Breast cancer Maternal Aunt   . Cancer Neg Hx   . Heart disease Neg Hx     Social History   Socioeconomic History  . Marital status: Married  Spouse name: Jeneen Rinks  . Number of children: 3  . Years of education: Not on file  . Highest education level: Not on file  Occupational History  . Occupation: unemployed  Tobacco Use  . Smoking status: Current Every Day Smoker    Packs/day: 0.25    Years: 15.00    Pack years: 3.75    Types: Cigarettes  . Smokeless tobacco: Never Used  . Tobacco comment: 3-7 cigars a day  Substance and Sexual Activity  . Alcohol use: Yes    Alcohol/week: 0.0 standard drinks    Comment: occasional 1-2x/yr  . Drug use: No  . Sexual activity: Yes    Partners: Male    Birth control/protection: Surgical  Other Topics Concern  . Not on file  Social History Narrative  . Not on file   Social Determinants of Health   Financial Resource Strain:   . Difficulty of Paying Living Expenses: Not on file  Food Insecurity:   . Worried About Charity fundraiser in the Last Year: Not on file  . Ran Out of Food in the Last Year: Not on file  Transportation Needs:   . Lack of Transportation (Medical): Not on file  . Lack of Transportation (Non-Medical): Not on file  Physical Activity:   . Days of Exercise per Week: Not on file  . Minutes of Exercise per Session: Not on file  Stress:   . Feeling of Stress : Not on file  Social Connections:   . Frequency of Communication with Friends and Family: Not on file  . Frequency of Social Gatherings with Friends and Family: Not on file  . Attends Religious Services: Not on file  . Active Member of Clubs or Organizations: Not on file  . Attends Archivist Meetings: Not on file  . Marital Status: Not on file  Intimate Partner Violence:   . Fear of Current or Ex-Partner: Not on  file  . Emotionally Abused: Not on file  . Physically Abused: Not on file  . Sexually Abused: Not on file     Current Outpatient Medications:  .  levothyroxine (SYNTHROID) 88 MCG tablet, Take 1 tablet (88 mcg total) by mouth daily. (have labs done 8 weeks after new dose), Disp: 90 tablet, Rfl: 0 .  rosuvastatin (CRESTOR) 5 MG tablet, Take 1 tablet (5 mg total) by mouth daily., Disp: 90 tablet, Rfl: 3 .  busPIRone (BUSPAR) 10 MG tablet, Take 1/2 tablet twice daily for 2 weeks, then increase to 1 tablet twice daily., Disp: 180 tablet, Rfl: 1  No Known Allergies   ROS  Constitutional: Negative for fever or weight change.  Respiratory: Negative for cough and shortness of breath.   Cardiovascular: Negative for chest pain or palpitations.  Gastrointestinal: Negative for abdominal pain, no bowel changes.  Musculoskeletal: Negative for gait problem or joint swelling.  Skin: Negative for rash.  Neurological: Negative for dizziness or headache.  No other specific complaints in a complete review of systems (except as listed in HPI above).  Objective  Vitals:   02/12/19 0932  BP: 112/72  Pulse: 71  Resp: 14  Temp: 97.9 F (36.6 C)  TempSrc: Temporal  SpO2: 95%  Weight: 141 lb 11.2 oz (64.3 kg)  Height: 5' 1"  (1.549 m)    Body mass index is 26.77 kg/m.  Physical Exam  Constitutional: Patient appears well-developed and well-nourished. No distress.  HENT: Head: Normocephalic and atraumatic. Ears: B TMs ok, no erythema or effusion; Nose: Nose normal.  Mouth/Throat: Oropharynx is clear and moist. No oropharyngeal exudate.  Eyes: Conjunctivae and EOM are normal. Pupils are equal, round, and reactive to light. No scleral icterus.  Neck: Normal range of motion. Neck supple. No JVD present. No thyromegaly present.  Cardiovascular: Normal rate, regular rhythm and normal heart sounds.  No murmur heard. No BLE edema. Pulmonary/Chest: Effort normal and breath sounds normal. No respiratory  distress. Abdominal: Soft. Bowel sounds are normal, no distension. There is no tenderness. no masses Breast: no lumps or masses, no nipple discharge or rashes FEMALE GENITALIA:  External genitalia normal External urethra normal Vaginal vault normal without discharge or lesions Cervix normal without discharge or lesions Bimanual exam normal without masses RECTAL: Deferred Musculoskeletal: Normal range of motion, no joint effusions. No gross deformities Neurological: he is alert and oriented to person, place, and time. No cranial nerve deficit. Coordination, balance, strength, speech and gait are normal.  Skin: Skin is warm and dry. No rash noted. No erythema.  Psychiatric: Patient has a normal mood and affect. behavior is normal. Judgment and thought content normal.   Recent Results (from the past 2160 hour(s))  Lipid panel     Status: None   Collection Time: 01/08/19 12:00 AM  Result Value Ref Range   Cholesterol 138 <200 mg/dL   HDL 58 > OR = 50 mg/dL   Triglycerides 95 <150 mg/dL   LDL Cholesterol (Calc) 62 mg/dL (calc)    Comment: Reference range: <100 . Desirable range <100 mg/dL for primary prevention;   <70 mg/dL for patients with CHD or diabetic patients  with > or = 2 CHD risk factors. Marland Kitchen LDL-C is now calculated using the Martin-Hopkins  calculation, which is a validated novel method providing  better accuracy than the Friedewald equation in the  estimation of LDL-C.  Cresenciano Genre et al. Annamaria Helling. 7209;470(96): 2061-2068  (http://education.[facility].com/faq/FAQ164)    Total CHOL/HDL Ratio 2.4 <5.0 (calc)   Non-HDL Cholesterol (Calc) 80 <130 mg/dL (calc)    Comment: For patients with diabetes plus 1 major ASCVD risk  factor, treating to a non-HDL-C goal of <100 mg/dL  (LDL-C of <70 mg/dL) is considered a therapeutic  option.   TSH     Status: None   Collection Time: 01/08/19 12:00 AM  Result Value Ref Range   TSH 1.76 mIU/L    Comment:           Reference  Range .           > or = 20 Years  0.40-4.50 .                Pregnancy Ranges           First trimester    0.26-2.66           Second trimester   0.55-2.73           Third trimester    0.43-2.91   BMP w/ GFR - [facility]     Status: Abnormal   Collection Time: 01/08/19 12:00 AM  Result Value Ref Range   Glucose, Bld 93 65 - 99 mg/dL    Comment: .            Fasting reference interval .    BUN 6 (L) 7 - 25 mg/dL   Creat 0.79 0.50 - 1.10 mg/dL   GFR, Est Non African American 98 > OR = 60 mL/min/1.80m   GFR, Est African American 114 > OR = 60 mL/min/1.743m  BUN/Creatinine Ratio 8 6 -  22 (calc)   Sodium 140 135 - 146 mmol/L   Potassium 3.9 3.5 - 5.3 mmol/L   Chloride 106 98 - 110 mmol/L   CO2 27 20 - 32 mmol/L   Calcium 9.4 8.6 - 10.2 mg/dL  CBC with Differential/Platelet     Status: Abnormal   Collection Time: 01/08/19 12:00 AM  Result Value Ref Range   WBC 8.5 3.8 - 10.8 Thousand/uL   RBC 5.28 (H) 3.80 - 5.10 Million/uL   Hemoglobin 12.0 11.7 - 15.5 g/dL   HCT 40.2 35.0 - 45.0 %   MCV 76.1 (L) 80.0 - 100.0 fL   MCH 22.7 (L) 27.0 - 33.0 pg   MCHC 29.9 (L) 32.0 - 36.0 g/dL   RDW 13.1 11.0 - 15.0 %   Platelets 265 140 - 400 Thousand/uL   MPV 9.7 7.5 - 12.5 fL   Neutro Abs 5,143 1,500 - 7,800 cells/uL   Lymphs Abs 2,839 850 - 3,900 cells/uL   Absolute Monocytes 417 200 - 950 cells/uL   Eosinophils Absolute 68 15 - 500 cells/uL   Basophils Absolute 34 0 - 200 cells/uL   Neutrophils Relative % 60.5 %   Total Lymphocyte 33.4 %   Monocytes Relative 4.9 %   Eosinophils Relative 0.8 %   Basophils Relative 0.4 %  Iron, TIBC and Ferritin Panel     Status: None   Collection Time: 01/08/19 12:00 AM  Result Value Ref Range   Iron 96 40 - 190 mcg/dL   TIBC 299 250 - 450 mcg/dL (calc)   %SAT 32 16 - 45 % (calc)   Ferritin 49 16 - 154 ng/mL  Hepatic function panel     Status: None   Collection Time: 01/15/19  3:45 PM  Result Value Ref Range   Total Protein 7.5 6.5 - 8.1 g/dL    Albumin 4.4 3.5 - 5.0 g/dL   AST 17 15 - 41 U/L   ALT 14 0 - 44 U/L   Alkaline Phosphatase 77 38 - 126 U/L   Total Bilirubin 0.8 0.3 - 1.2 mg/dL   Bilirubin, Direct <0.1 0.0 - 0.2 mg/dL   Indirect Bilirubin NOT CALCULATED 0.3 - 0.9 mg/dL    Comment: Performed at  [facility], [address] [place], [place] [address]  CBC with Differential/Platelet     Status: Abnormal   Collection Time: 01/15/19  3:45 PM  Result Value Ref Range   WBC 8.6 4.0 - 10.5 K/uL   RBC 5.14 (H) 3.87 - 5.11 MIL/uL   Hemoglobin 11.8 (L) 12.0 - 15.0 g/dL   HCT 38.9 36.0 - 46.0 %   MCV 75.7 (L) 80.0 - 100.0 fL   MCH 23.0 (L) 26.0 - 34.0 pg   MCHC 30.3 30.0 - 36.0 g/dL   RDW 13.9 11.5 - 15.5 %   Platelets 238 150 - 400 K/uL   nRBC 0.0 0.0 - 0.2 %   Neutrophils Relative % 59 %   Neutro Abs 5.0 1.7 - 7.7 K/uL   Lymphocytes Relative 35 %   Lymphs Abs 3.0 0.7 - 4.0 K/uL   Monocytes Relative 5 %   Monocytes Absolute 0.5 0.1 - 1.0 K/uL   Eosinophils Relative 1 %   Eosinophils Absolute 0.1 0.0 - 0.5 K/uL   Basophils Relative 0 %   Basophils Absolute 0.0 0.0 - 0.1 K/uL   Immature Granulocytes 0 %   Abs Immature Granulocytes 0.02 0.00 - 0.07 K/uL    Comment: Performed at [facility],  [place], [place] [address]  Hemoglobinopathy evaluation     Status: None   Collection Time: 01/15/19  3:45 PM  Result Value Ref Range   Hgb A2 Quant 2.1 1.8 - 3.2 %   Hgb F Quant 0.0 0.0 - 2.0 %   Hgb S Quant 0.0 0.0 %   Hgb C 0.0 0.0 %   Hgb A 97.9 96.4 - 98.8 %   Hgb Variant 0.0 0.0 %   Please Note: Comment     Comment: (NOTE) Normal adult hemoglobin present. Performed At: [facility] [address] [place], [place] [id number] Rush Farmer MD UX:[id number]   Technologist smear review     Status: None   Collection Time: 01/15/19  3:46 PM  Result Value Ref Range   WBC Morphology UNREMARKABLE    RBC Morphology FEW OVALOCYTES NOTED    Tech Review PLATELETS APPEAR ADEQUATE      Comment: Normal platelet morphology Performed at [facility], [address] [place], [place] [address]     Fall Risk: Fall Risk  02/12/2019 01/08/2019 05/18/2018 03/19/2017 03/17/2017  Falls in the past year? 0 0 0 No No  Number falls in past yr: 0 0 0 - -  Injury with Fall? 0 0 0 - -  Follow up Falls evaluation completed Falls evaluation completed Falls evaluation completed - -   Assessment & Plan  1. Well woman exam - sertraline (ZOLOFT) 25 MG tablet; Take 1/2 tablet once daily for 7 days, then increase to 1 tablet once daily.  Dispense: 30 tablet; Refill: 1 - hydrOXYzine (VISTARIL) 25 MG capsule; Take 1 capsule (25 mg total) by mouth 3 (three) times daily as needed for anxiety (insomnia).  Dispense: 30 capsule; Refill: 0 - Cytology - PAP - docusate sodium (COLACE) 100 MG capsule; Take 1 capsule (100 mg total) by mouth 2 (two) times daily as needed for mild constipation or moderate constipation.  Dispense: 30 capsule; Refill: 0  2. Anxiety - sertraline (ZOLOFT) 25 MG tablet; Take 1/2 tablet once daily for 7 days, then increase to 1 tablet once daily.  Dispense: 30 tablet; Refill: 1 - hydrOXYzine (VISTARIL) 25 MG capsule; Take 1 capsule (25 mg total) by mouth 3 (three) times daily as needed for anxiety (insomnia).  Dispense: 30 capsule; Refill: 0  3. Insomnia, unspecified type - sertraline (ZOLOFT) 25 MG tablet; Take 1/2 tablet once daily for 7 days, then increase to 1 tablet once daily.  Dispense: 30 tablet; Refill: 1 - hydrOXYzine (VISTARIL) 25 MG capsule; Take 1 capsule (25 mg total) by mouth 3 (three) times daily as needed for anxiety (insomnia).  Dispense: 30 capsule; Refill: 0  4. Moderate episode of recurrent major depressive disorder (HCC) - sertraline (ZOLOFT) 25 MG tablet; Take 1/2 tablet once daily for 7 days, then increase to 1 tablet once daily.  Dispense: 30 tablet; Refill: 1  5. Constipation, unspecified constipation type - docusate sodium (COLACE) 100 MG  capsule; Take 1 capsule (100 mg total) by mouth 2 (two) times daily as needed for mild constipation or moderate constipation.  Dispense: 30 capsule; Refill: 0  6. Cervical cancer screening - Cytology - PAP  -USPSTF grade A and B recommendations reviewed with patient; age-appropriate recommendations, preventive care, screening tests, etc discussed and encouraged; healthy living encouraged; see AVS for patient education given to patient -Discussed importance of 150 minutes of physical activity weekly, eat two servings of fish weekly, eat one serving of tree nuts ( cashews, pistachios, pecans,  almonds.Marland Kitchen) every other day, eat 6 servings of fruit/vegetables daily and drink plenty of water and avoid sweet beverages.

## 2019-02-15 LAB — CYTOLOGY - PAP: Diagnosis: NEGATIVE

## 2019-03-09 ENCOUNTER — Other Ambulatory Visit: Payer: Self-pay | Admitting: Family Medicine

## 2019-03-09 DIAGNOSIS — E063 Autoimmune thyroiditis: Secondary | ICD-10-CM

## 2019-03-09 MED ORDER — LEVOTHYROXINE SODIUM 88 MCG PO TABS: 88.0000 ug | ORAL_TABLET | Freq: Every day | ORAL | 0 refills | Status: DC

## 2019-06-15 ENCOUNTER — Other Ambulatory Visit: Payer: Self-pay | Admitting: Family Medicine

## 2019-06-15 DIAGNOSIS — E063 Autoimmune thyroiditis: Secondary | ICD-10-CM

## 2019-06-16 ENCOUNTER — Encounter: Payer: Self-pay | Admitting: Family Medicine

## 2019-06-16 DIAGNOSIS — E782 Mixed hyperlipidemia: Secondary | ICD-10-CM

## 2019-06-16 MED ORDER — LEVOTHYROXINE SODIUM 88 MCG PO TABS: 88.0000 ug | ORAL_TABLET | Freq: Every day | ORAL | 0 refills | Status: DC

## 2019-06-16 NOTE — Telephone Encounter (Signed)
Maybe Dr Carlynn Purl will fill it til May when we see what we are going to do??

## 2019-06-18 MED ORDER — ROSUVASTATIN CALCIUM 5 MG PO TABS: 5.0000 mg | ORAL_TABLET | Freq: Every day | ORAL | 3 refills | Status: DC

## 2019-07-30 ENCOUNTER — Ambulatory Visit: Payer: BC Managed Care – PPO | Admitting: Family Medicine

## 2019-08-10 ENCOUNTER — Ambulatory Visit (INDEPENDENT_AMBULATORY_CARE_PROVIDER_SITE_OTHER): Payer: BC Managed Care – PPO | Admitting: Family Medicine

## 2019-08-10 ENCOUNTER — Other Ambulatory Visit: Payer: Self-pay

## 2019-08-10 ENCOUNTER — Encounter: Payer: Self-pay | Admitting: Family Medicine

## 2019-08-10 VITALS — BP 116/84 | HR 78 | Temp 97.8°F | Resp 14 | Ht 61.0 in | Wt 145.9 lb

## 2019-08-10 DIAGNOSIS — F419 Anxiety disorder, unspecified: Secondary | ICD-10-CM

## 2019-08-10 DIAGNOSIS — E063 Autoimmune thyroiditis: Secondary | ICD-10-CM | POA: Diagnosis not present

## 2019-08-10 DIAGNOSIS — F331 Major depressive disorder, recurrent, moderate: Secondary | ICD-10-CM

## 2019-08-10 DIAGNOSIS — G4700 Insomnia, unspecified: Secondary | ICD-10-CM

## 2019-08-10 DIAGNOSIS — E782 Mixed hyperlipidemia: Secondary | ICD-10-CM | POA: Diagnosis not present

## 2019-08-10 DIAGNOSIS — D649 Anemia, unspecified: Secondary | ICD-10-CM

## 2019-08-10 DIAGNOSIS — R202 Paresthesia of skin: Secondary | ICD-10-CM

## 2019-08-10 DIAGNOSIS — R718 Other abnormality of red blood cells: Secondary | ICD-10-CM

## 2019-08-10 DIAGNOSIS — R5383 Other fatigue: Secondary | ICD-10-CM

## 2019-08-10 DIAGNOSIS — E559 Vitamin D deficiency, unspecified: Secondary | ICD-10-CM | POA: Diagnosis not present

## 2019-08-10 MED ORDER — SERTRALINE HCL 25 MG PO TABS: 25.0000 mg | ORAL_TABLET | Freq: Every day | ORAL | 1 refills | Status: DC

## 2019-08-10 MED ORDER — HYDROXYZINE PAMOATE 25 MG PO CAPS: 25.0000 mg | ORAL_CAPSULE | Freq: Every day | ORAL | 0 refills | Status: DC

## 2019-08-10 MED ORDER — BUSPIRONE HCL 5 MG PO TABS: 5.0000 mg | ORAL_TABLET | Freq: Two times a day (BID) | ORAL | 1 refills | Status: DC | PRN

## 2019-08-10 MED ORDER — LEVOTHYROXINE SODIUM 100 MCG PO TABS: 100.0000 ug | ORAL_TABLET | Freq: Every day | ORAL | 0 refills | Status: DC

## 2019-08-10 NOTE — Addendum Note (Signed)
Addended by: Danelle Berry on: 08/10/2019 09:10 PM   Modules accepted: Level of Service

## 2019-08-10 NOTE — Patient Instructions (Signed)
Come in 6-8 weeks from now for a repeat TSH - lab only   Follow up in 2-3 months to review med changes and leg symptoms

## 2019-08-10 NOTE — Progress Notes (Addendum)
Name: Tamara Walker   MRN: [id number]    DOB: September 04, 1985   Date:08/10/2019       Progress Note  Chief Complaint  Patient presents with  . Follow-up    6 months  . Depression    not really taking sertraline, and just started on hydroxyzine a few days ago  . Hyperlipidemia  . Hypothyroidism     Subjective:   Tamara Walker is a 34 y.o. female, presents to clinic for routine follow up on the conditions listed above.  Hyperlipidemia: Current Medication Regimen:  crestor 5 daily started about one year ago, taking daily no SE or concern Last Lipids: Lab Results  Component Value Date   CHOL 138 01/08/2019   HDL 58 01/08/2019   LDLCALC 62 01/08/2019   TRIG 95 01/08/2019   CHOLHDL 2.4 01/08/2019   - Current Diet:  Avoiding fried foods - Denies: Chest pain, shortness of breath, myalgias. - Documented aortic atherosclerosis? No   Hypothyroidism: Patient feels that she is low -she is currently on 88 mcg of levothyroxine, was previously on 100 mcg of levothyroxine.  Her labs at that time were normal but she states that her past PCP reduced it because she felt it was too high of a dose.  She has been struggling since then and has gained about 20 pounds.  She is exhausted all the time, feels extremely tired has no energy just to get through the day with her children at home she drinks red bowls all day and then she sleeps poorly at night. He does take medicine for second the morning on empty stomach and without other meds or food  Current Symptoms:  Feels extremely tired all the time, no energy, drinking redbull every day, can't sleep Gained weight  Most recent results are below; we will be repeating labs today. Lab Results  Component Value Date   TSH 1.76 01/08/2019   Leg sx, some tingling numbness Hx of anemia and Microcytosis    Anxiety meds -    Legs hurt when up on them for long periods of time  No swelling No varicose veins No calf pain Tingling pain and RLS   Patient  Active Problem List   Diagnosis Date Noted  . Mixed hyperlipidemia 05/21/2018  . Elevated glucose 05/20/2018  . Paternal family history of dementia 05/18/2018  . Family history of diabetes mellitus 05/18/2018  . Overweight (BMI 25.0-29.9) 05/18/2018  . Tobacco abuse 05/18/2018  . Microcytic erythrocytes 03/18/2017  . Vitamin D deficiency 11/08/2016  . Well woman exam without gynecological exam 05/03/2016  . Hashimoto's thyroiditis 10/31/2015  . Thyroid disease 01/03/2015  . Thyroid nodule 01/03/2015    Past Surgical History:  Procedure Laterality Date  . CESAREAN SECTION    . EYE SURGERY    . TUBAL LIGATION  2016    Family History  Problem Relation Age of Onset  . Dementia Father 36       Died in his early 81's  . Heart attack Father 28       Died in his early 47's  . Diabetes Maternal Grandfather   . Diabetes Paternal Grandmother   . Dementia Paternal Grandmother 83  . Breast cancer Maternal Aunt   . Cancer Neg Hx   . Heart disease Neg Hx     Social History   Tobacco Use  . Smoking status: Current Every Day Smoker    Packs/day: 0.25    Years: 15.00    Pack years: 3.75  Types: Cigarettes  . Smokeless tobacco: Never Used  . Tobacco comment: 3-7 cigars a day  Substance Use Topics  . Alcohol use: Yes    Alcohol/week: 0.0 standard drinks    Comment: occasional 1-2x/yr  . Drug use: No      Current Outpatient Medications:  .  docusate sodium (COLACE) 100 MG capsule, Take 1 capsule (100 mg total) by mouth 2 (two) times daily as needed for mild constipation or moderate constipation., Disp: 30 capsule, Rfl: 0 .  hydrOXYzine (VISTARIL) 25 MG capsule, Take 1 capsule (25 mg total) by mouth 3 (three) times daily as needed for anxiety (insomnia)., Disp: 30 capsule, Rfl: 0 .  levothyroxine (SYNTHROID) 88 MCG tablet, Take 1 tablet (88 mcg total) by mouth daily. (have labs done 8 weeks after new dose), Disp: 90 tablet, Rfl: 0 .  rosuvastatin (CRESTOR) 5 MG tablet, Take 1  tablet (5 mg total) by mouth daily., Disp: 90 tablet, Rfl: 3 .  sertraline (ZOLOFT) 25 MG tablet, Take 1/2 tablet once daily for 7 days, then increase to 1 tablet once daily., Disp: 30 tablet, Rfl: 1  No Known Allergies  Chart Review Today: I personally reviewed active problem list, medication list, allergies, family history, social history, health maintenance, notes from last encounter, lab results, imaging with the patient/caregiver today.   Review of Systems  10 Systems reviewed and are negative for acute change except as noted in the HPI.   Objective:    Vitals:   08/10/19 1143  BP: 116/84  Pulse: 78  Resp: 14  Temp: 97.8 F (36.6 C)  SpO2: 99%  Weight: 145 lb 14.4 oz (66.2 kg)  Height: 5\' 1"  (1.549 m)    Body mass index is 27.57 kg/m.  Physical Exam Vitals and nursing note reviewed.  Constitutional:      General: She is not in acute distress.    Appearance: Normal appearance. She is well-developed. She is not ill-appearing, toxic-appearing or diaphoretic.     Interventions: Face mask in place.  HENT:     Head: Normocephalic and atraumatic.     Right Ear: External ear normal.     Left Ear: External ear normal.  Eyes:     General: Lids are normal. No scleral icterus.       Right eye: No discharge.        Left eye: No discharge.     Conjunctiva/sclera: Conjunctivae normal.  Neck:     Trachea: Phonation normal. No tracheal deviation.  Cardiovascular:     Rate and Rhythm: Normal rate and regular rhythm.     Pulses: Normal pulses.          Radial pulses are 2+ on the right side and 2+ on the left side.       Posterior tibial pulses are 2+ on the right side and 2+ on the left side.     Heart sounds: Normal heart sounds. No murmur. No friction rub. No gallop.   Pulmonary:     Effort: Pulmonary effort is normal. No respiratory distress.     Breath sounds: Normal breath sounds. No stridor. No wheezing, rhonchi or rales.  Chest:     Chest wall: No tenderness.    Abdominal:     General: Bowel sounds are normal. There is no distension.     Palpations: Abdomen is soft.     Tenderness: There is no abdominal tenderness. There is no guarding or rebound.  Musculoskeletal:     Cervical back: Normal range  of motion and neck supple.     Right lower leg: No edema.     Left lower leg: No edema.  Lymphadenopathy:     Cervical: No cervical adenopathy.  Skin:    General: Skin is warm and dry.     Coloration: Skin is not jaundiced or pale.     Findings: No rash.  Neurological:     Mental Status: She is alert.     Motor: No abnormal muscle tone.     Gait: Gait normal.  Psychiatric:        Speech: Speech normal.        Behavior: Behavior normal.        PHQ2/9: Depression screen [facility] 2/9 08/10/2019 02/12/2019 01/08/2019 05/18/2018 03/19/2017  Decreased Interest 1 0 0 0 0  Down, Depressed, Hopeless 0 0 0 0 0  PHQ - 2 Score 1 0 0 0 0  Altered sleeping 3 3 0 1 -  Tired, decreased energy 3 3 0 0 -  Change in appetite 3 3 0 0 -  Feeling bad or failure about yourself  1 0 0 0 -  Trouble concentrating - 0 0 0 -  Moving slowly or fidgety/restless 1 2 0 0 -  Suicidal thoughts 0 0 0 0 -  PHQ-9 Score 12 11 0 1 -  Difficult doing work/chores Somewhat difficult Not difficult at all Not difficult at all Not difficult at all -    phq 9 is positive - reviewed today  Fall Risk: Fall Risk  08/10/2019 02/12/2019 01/08/2019 05/18/2018 03/19/2017  Falls in the past year? 0 0 0 0 No  Number falls in past yr: 0 0 0 0 -  Injury with Fall? 0 0 0 0 -  Follow up - Falls evaluation completed Falls evaluation completed Falls evaluation completed -    Functional Status Survey: Is the patient deaf or have difficulty hearing?: No Does the patient have difficulty seeing, even when wearing glasses/contacts?: No Does the patient have difficulty concentrating, remembering, or making decisions?: No Does the patient have difficulty walking or climbing stairs?: No Does the patient  have difficulty dressing or bathing?: No Does the patient have difficulty doing errands alone such as visiting a doctor's office or shopping?: No   Assessment & Plan:   1. Hashimoto's thyroiditis Patient endorses worsening of symptoms since her levothyroxine dose was decreased, reviewed lab work from that period of time, she had normal labs and no side effects of her dose she denies any palpitations, diarrhea, insomnia, sweats Will increase her dose today and check her thyroid, encouraged her to come and recheck TSH in about 6 to 8 weeks She does have decreased mood, worsening PHQ screening, endorses severe fatigue and exhaustion all day long - levothyroxine (SYNTHROID) 100 MCG tablet; Take 1 tablet (100 mcg total) by mouth daily.  Dispense: 90 tablet; Refill: 0 - TSH  2. Vitamin D deficiency - VITAMIN D 25 Hydroxy (Vit-D Deficiency, Fractures)  3. Mixed hyperlipidemia Compliant with statin medication, no myalgias side effects or concerns at this time, recheck labs and refill meds if well controlled and normal - COMPLETE METABOLIC PANEL WITH GFR - Lipid panel  4. Insomnia, unspecified type Insomnia, she was given hydroxyzine 25 mg to take 3 times a day as needed for anxiety it was too sedating so she has been using it at night and it does help a little bit for her insomnia. She was also given Zoloft 25 mg but has not taken it  consistently. - sertraline (ZOLOFT) 25 MG tablet; Take 1 tablet (25 mg total) by mouth at bedtime.  Dispense: 90 tablet; Refill: 1 - hydrOXYzine (VISTARIL) 25 MG capsule; Take 1 capsule (25 mg total) by mouth at bedtime. For insomnia  Dispense: 30 capsule; Refill: 0  5. Moderate episode of recurrent major depressive disorder (HCC) Worsening mood, increasing levothyroxine dose - hope this will help a little bit with her mood and energy. Strongly encouraged her to take sertraline 25 mg at night and try it for the next 2 to 3 months. - sertraline (ZOLOFT) 25 MG tablet;  Take 1 tablet (25 mg total) by mouth at bedtime.  Dispense: 90 tablet; Refill: 1 Close follow-up  6. Anemia, unspecified type History of anemia, with a hematology with negative thalassemia testing her last iron panel was a while ago but it was normal, etiology of anemia and microcytosis is unknown -with severe fatigue symptoms will recheck labs today - CBC with Differential/Platelet - Iron, TIBC and Ferritin Panel  7. Paresthesia of lower extremity Patient endorses restless leg type symptoms, pain and tingling in her legs especially if she is stood up throughout the day prolonged periods of time. She denies any swelling to her lower extremities denies any varicosities or skin changes consistent with venous stasis, may be iron deficiency or B12 deficiency may also be worsening symptoms due to chemical hypothyroid - CBC with Differential/Platelet - TSH - Iron, TIBC and Ferritin Panel  8. Fatigue, unspecified type See above may be due to depression, chemical hypothyroid, anemia - CBC with Differential/Platelet - TSH - Iron, TIBC and Ferritin Panel  9. Microcytosis See above, patient new to me, has seen hematology but she does not know why she has anemia and microcytosis. - CBC with Differential/Platelet - Iron, TIBC and Ferritin Panel  10. Anxiety and depression Encouraged to restart sertraline 25 mg daily for the next 2 to 3 months explained how it takes at least 6 weeks to get to a steady state in her brain chemistry and she can expect some side effects that are likely to resolve after the first 2 weeks. Close follow-up - sertraline (ZOLOFT) 25 MG tablet; Take 1 tablet (25 mg total) by mouth at bedtime.  Dispense: 90 tablet; Refill: 1 - hydrOXYzine (VISTARIL) 25 MG capsule; Take 1 capsule (25 mg total) by mouth at bedtime. For insomnia  Dispense: 30 capsule; Refill: 0   F/up in 2 months for mood, thyroid, leg sx/anemia  Greater than 50% of this visit was spent in direct face-to-face  counseling, obtaining history and physical, discussing and educating pt on treatment plan.  Total time of this visit was >40 min, due to addressing multiple chronic conditions, abnormal labs, med changes, and multiple acute complaints including worsening anxiety, depression, paresthesias, fatigue, patient also transitioning to care with new PCP today.Girtha Rm of time involved but was not limited to reviewing chart (recent and pertinent OV notes and labs), documentation in EMR, and coordinating care and treatment plan.   Danelle Berry, PA-C 08/10/19 12:06 PM

## 2019-08-11 ENCOUNTER — Other Ambulatory Visit: Payer: Self-pay | Admitting: Family Medicine

## 2019-08-11 LAB — CBC WITH DIFFERENTIAL/PLATELET
Absolute Monocytes: 459 cells/uL (ref 200–950)
Basophils Absolute: 30 cells/uL (ref 0–200)
Basophils Relative: 0.4 %
Eosinophils Absolute: 111 cells/uL (ref 15–500)
Eosinophils Relative: 1.5 %
HCT: 40.2 % (ref 35.0–45.0)
Hemoglobin: 12.1 g/dL (ref 11.7–15.5)
Lymphs Abs: 2575 cells/uL (ref 850–3900)
MCH: 23 pg — ABNORMAL LOW (ref 27.0–33.0)
MCHC: 30.1 g/dL — ABNORMAL LOW (ref 32.0–36.0)
MCV: 76.4 fL — ABNORMAL LOW (ref 80.0–100.0)
MPV: 9.5 fL (ref 7.5–12.5)
Monocytes Relative: 6.2 %
Neutro Abs: 4225 cells/uL (ref 1500–7800)
Neutrophils Relative %: 57.1 %
Platelets: 297 10*3/uL (ref 140–400)
RBC: 5.26 10*6/uL — ABNORMAL HIGH (ref 3.80–5.10)
RDW: 13 % (ref 11.0–15.0)
Total Lymphocyte: 34.8 %
WBC: 7.4 10*3/uL (ref 3.8–10.8)

## 2019-08-11 LAB — LIPID PANEL
Cholesterol: 166 mg/dL (ref <200)
HDL: 66 mg/dL (ref >=50)
LDL Cholesterol (Calc): 80 mg/dL (calc)
Non-HDL Cholesterol (Calc): 100 mg/dL (calc) (ref <130)
Total CHOL/HDL Ratio: 2.5 (calc) (ref <5.0)
Triglycerides: 106 mg/dL (ref <150)

## 2019-08-11 LAB — IRON,TIBC AND FERRITIN PANEL
%SAT: 30 % (calc) (ref 16–45)
Ferritin: 35 ng/mL (ref 16–154)
Iron: 102 ug/dL (ref 40–190)
TIBC: 345 mcg/dL (calc) (ref 250–450)

## 2019-08-11 LAB — COMPLETE METABOLIC PANEL WITH GFR
AG Ratio: 1.8 (calc) (ref 1.0–2.5)
ALT: 13 U/L (ref 6–29)
AST: 17 U/L (ref 10–30)
Albumin: 4.4 g/dL (ref 3.6–5.1)
Alkaline phosphatase (APISO): 84 U/L (ref 31–125)
BUN/Creatinine Ratio: 7 (calc) (ref 6–22)
BUN: 5 mg/dL — ABNORMAL LOW (ref 7–25)
CO2: 29 mmol/L (ref 20–32)
Calcium: 9.4 mg/dL (ref 8.6–10.2)
Chloride: 106 mmol/L (ref 98–110)
Creat: 0.76 mg/dL (ref 0.50–1.10)
GFR, Est African American: 119 mL/min/{1.73_m2} (ref >=60)
GFR, Est Non African American: 102 mL/min/{1.73_m2} (ref >=60)
Globulin: 2.4 g/dL (calc) (ref 1.9–3.7)
Glucose, Bld: 98 mg/dL (ref 65–99)
Potassium: 4.5 mmol/L (ref 3.5–5.3)
Sodium: 141 mmol/L (ref 135–146)
Total Bilirubin: 0.4 mg/dL (ref 0.2–1.2)
Total Protein: 6.8 g/dL (ref 6.1–8.1)

## 2019-08-11 LAB — TSH: TSH: 2.18 mIU/L

## 2019-08-11 LAB — VITAMIN D 25 HYDROXY (VIT D DEFICIENCY, FRACTURES): Vit D, 25-Hydroxy: 20 ng/mL — ABNORMAL LOW (ref 30–100)

## 2019-08-11 MED ORDER — VITAMIN D (ERGOCALCIFEROL) 1.25 MG (50000 UNIT) PO CAPS: 50000.0000 [IU] | ORAL_CAPSULE | ORAL | 0 refills | Status: DC

## 2019-09-27 ENCOUNTER — Other Ambulatory Visit: Payer: Self-pay

## 2019-09-27 DIAGNOSIS — Z5181 Encounter for therapeutic drug level monitoring: Secondary | ICD-10-CM

## 2019-09-27 DIAGNOSIS — E063 Autoimmune thyroiditis: Secondary | ICD-10-CM

## 2019-09-27 LAB — TSH: TSH: 0.78 mIU/L

## 2019-10-11 NOTE — Progress Notes (Signed)
Patient ID: Tamara Walker, female    DOB: 21-Jan-1986, 34 y.o.   MRN: [id number]  PCP: Danelle Berry, PA-C  Chief Complaint  Patient presents with  . Rash    [place], rash has gotten more irritating and starting to spread    Subjective:   Tamara Walker is a 34 y.o. female, presents to clinic with CC of the following:  Chief Complaint  Patient presents with  . Rash    [place], [place] saturday, rash has gotten more irritating and starting to spread    HPI:  Patient is a 34 year old female patient of Danelle Berry Last visit with her was 08/10/2019 with that note reviewed Numerous labs obtained at that visit also reviewed. Follows up today with a rash. She noted today she has a Covid test pending, although she denies any symptoms of concern.  She noted her kids have a runny nose, and she just wanted to get tested to be safe.  She notes a rash started last Thursday, with her at the [facility] the week prior, and she did use a hot tub while there.  It started in the upper arm and axilla region, and is extended some down the upper extremity, some on to the anterior chest,  denies any facial involvement. It is quite itchy, not painful. No fevers, not feeling ill, no shortness of breath or throat closing feelings.  No near passing out feelings. Has not tried anything to help presently Has not applied any new products topically. She denies any history of rashes like this in her past.   Patient Active Problem List   Diagnosis Date Noted  . Mixed hyperlipidemia 05/21/2018  . Elevated glucose 05/20/2018  . Paternal family history of dementia 05/18/2018  . Family history of diabetes mellitus 05/18/2018  . Overweight (BMI 25.0-29.9) 05/18/2018  . Tobacco abuse 05/18/2018  . Microcytic erythrocytes 03/18/2017  . Vitamin D deficiency 11/08/2016  . Well woman exam without  gynecological exam 05/03/2016  . Hashimoto's thyroiditis 10/31/2015  . Thyroid disease 01/03/2015  . Thyroid nodule 01/03/2015      Current Outpatient Medications:  .  busPIRone (BUSPAR) 5 MG tablet, Take 1-2 tablets (5-10 mg total) by mouth 2 (two) times daily as needed (anxiety/stress). (max dose 60 mg in 24 hours), Disp: 120 tablet, Rfl: 1 .  hydrOXYzine (VISTARIL) 25 MG capsule, Take 1 capsule (25 mg total) by mouth at bedtime. For insomnia, Disp: 30 capsule, Rfl: 0 .  levothyroxine (SYNTHROID) 100 MCG tablet, Take 1 tablet (100 mcg total) by mouth daily., Disp: 90 tablet, Rfl: 0 .  rosuvastatin (CRESTOR) 5 MG tablet, Take 1 tablet (5 mg total) by mouth daily., Disp: 90 tablet, Rfl: 3 .  sertraline (ZOLOFT) 25 MG tablet, Take 1 tablet (25 mg total) by mouth at bedtime., Disp: 90 tablet, Rfl: 1 .  Vitamin D, Ergocalciferol, (DRISDOL) 1.25 MG (50000 UNIT) CAPS capsule, Take 1 capsule (50,000 Units total) by mouth every 7 (seven) days., Disp: 12 capsule, Rfl: 0   No Known Allergies   Past Surgical History:  Procedure Laterality Date  . CESAREAN SECTION    . EYE SURGERY    . TUBAL LIGATION  2016     Family History  Problem Relation Age of Onset  . Dementia Father 31       Died in his early 54's  . Heart attack Father  40       Died in his early 57's  . Diabetes Maternal Grandfather   . Diabetes Paternal Grandmother   . Dementia Paternal Grandmother 57  . Breast cancer Maternal Aunt   . Cancer Neg Hx   . Heart disease Neg Hx      Social History   Tobacco Use  . Smoking status: Current Every Day Smoker    Packs/day: 0.25    Years: 15.00    Pack years: 3.75    Types: Cigarettes  . Smokeless tobacco: Never Used  . Tobacco comment: 3-7 cigars a day  Substance Use Topics  . Alcohol use: Yes    Alcohol/week: 0.0 standard drinks    Comment: occasional 1-2x/yr    With staff assistance, above reviewed with the patient today.  ROS: As per HPI, otherwise no specific  complaints on a limited and focused system review   No results found for this or any previous visit (from the past 72 hour(s)).   PHQ2/9: Depression screen [facility] 2/9 10/12/2019 08/10/2019 02/12/2019 01/08/2019 05/18/2018  Decreased Interest 0 1 0 0 0  Down, Depressed, Hopeless 0 0 0 0 0  PHQ - 2 Score 0 1 0 0 0  Altered sleeping 0 3 3 0 1  Tired, decreased energy 0 3 3 0 0  Change in appetite 0 3 3 0 0  Feeling bad or failure about yourself  0 1 0 0 0  Trouble concentrating 0 - 0 0 0  Moving slowly or fidgety/restless 0 1 2 0 0  Suicidal thoughts 0 0 0 0 0  PHQ-9 Score 0 12 11 0 1  Difficult doing work/chores Not difficult at all Somewhat difficult Not difficult at all Not difficult at all Not difficult at all   PHQ-2/9 Result is neg  Fall Risk: Fall Risk  10/12/2019 08/10/2019 02/12/2019 01/08/2019 05/18/2018  Falls in the past year? 0 0 0 0 0  Number falls in past yr: 0 0 0 0 0  Injury with Fall? 0 0 0 0 0  Follow up - - Falls evaluation completed Falls evaluation completed Falls evaluation completed      Objective:   Vitals:   10/12/19 0943  BP: 122/84  Pulse: 83  Resp: 16  Temp: 97.9 F (36.6 C)  TempSrc: Temporal  SpO2: 99%  Weight: 148 lb 4.8 oz (67.3 kg)  Height: 5\' 1"  (1.549 m)    Body mass index is 28.02 kg/m.  Physical Exam   NAD, masked, pleasant HEENT - [place]/AT, sclera anicteric, conj - non-inj'ed, pharynx clear Neck - supple, no adenopathy, no rigidity Car - RRR, not tachycardic Pulm -respiratory rate normal at rest, breathing comfortably Skin-scattered areas of mildly erythematous and raised follicles evident in the left and right axillary regions, more anterior to the axilla and somewhat down the proximal upper extremity.  Sparse areas noted in the distal upper extremities, also some small areas of erythematous raised follicles on the upper anterior chest, no areas on the back noted today.  The face was spared. Neuro/psychiatric - affect was not flat,  appropriate with conversation  Alert with normal speech  Results for orders placed or performed in visit on 09/27/19  TSH  Result Value Ref Range   TSH 0.78 mIU/L       Assessment & Plan:   1. Rash - Folliculitis Do feel this is consistent with a folliculitis, possibly a hot tub folliculitis, and if so explained this entity to her, the likely causative  source, and how is often self-limited and improved without any antibiotics needed to manage.  Possible heat contribution as well. Important not to scratch at these areas presently and recommended Discussed options including a topical entity versus an oral product, and she much prefers the oral given how itchy she is presently. Okay to use a prednisone type product with a rapid wean as prescribed Can use a topical 1% hydrocortisone as well to help in certain areas that may be more itchy. Also can use a Benadryl type product, may make drowsy during the day, but can take 50 mg at bedtime to help with the itch. Do expect this to improve over time,  - predniSONE (DELTASONE) 10 MG tablet; Take four tabs daily X 2 days, then two tabs daily X 2 days, then one tab daily X 2 days  Dispense: 14 tablet; Refill: 0  Should follow-up if not improving or more problematic      Jamelle Haring, MD 10/12/19 9:56 AM

## 2019-10-12 ENCOUNTER — Ambulatory Visit: Payer: BC Managed Care – PPO | Admitting: Internal Medicine

## 2019-10-12 ENCOUNTER — Encounter: Payer: Self-pay | Admitting: Internal Medicine

## 2019-10-12 ENCOUNTER — Other Ambulatory Visit: Payer: Self-pay

## 2019-10-12 VITALS — BP 122/84 | HR 83 | Temp 97.9°F | Resp 16 | Ht 61.0 in | Wt 148.3 lb

## 2019-10-12 DIAGNOSIS — L739 Follicular disorder, unspecified: Secondary | ICD-10-CM

## 2019-10-12 MED ORDER — PREDNISONE 10 MG PO TABS: ORAL_TABLET | ORAL | 0 refills | Status: DC

## 2019-10-12 NOTE — Patient Instructions (Signed)
Take the prednisone as directed  Also do not scratch at the rash areas and keep the areas clean.  Can use a Benadryl product-50 mg at bedtime as needed for the itch to help as well

## 2019-11-05 ENCOUNTER — Other Ambulatory Visit: Payer: Self-pay | Admitting: Family Medicine

## 2019-11-05 DIAGNOSIS — E063 Autoimmune thyroiditis: Secondary | ICD-10-CM

## 2019-11-06 ENCOUNTER — Other Ambulatory Visit: Payer: Self-pay | Admitting: Family Medicine

## 2019-11-06 DIAGNOSIS — E063 Autoimmune thyroiditis: Secondary | ICD-10-CM

## 2019-11-08 MED ORDER — LEVOTHYROXINE SODIUM 100 MCG PO TABS: 100.0000 ug | ORAL_TABLET | Freq: Every day | ORAL | 0 refills | Status: DC

## 2019-11-15 ENCOUNTER — Encounter: Payer: Self-pay | Admitting: Family Medicine

## 2019-11-15 ENCOUNTER — Other Ambulatory Visit: Payer: Self-pay

## 2019-11-15 ENCOUNTER — Ambulatory Visit: Payer: BC Managed Care – PPO | Admitting: Family Medicine

## 2019-11-15 VITALS — BP 110/70 | HR 89 | Temp 98.3°F | Resp 16 | Ht 61.0 in | Wt 146.9 lb

## 2019-11-15 DIAGNOSIS — R21 Rash and other nonspecific skin eruption: Secondary | ICD-10-CM

## 2019-11-15 DIAGNOSIS — E782 Mixed hyperlipidemia: Secondary | ICD-10-CM

## 2019-11-15 DIAGNOSIS — E063 Autoimmune thyroiditis: Secondary | ICD-10-CM | POA: Diagnosis not present

## 2019-11-15 DIAGNOSIS — F419 Anxiety disorder, unspecified: Secondary | ICD-10-CM

## 2019-11-15 DIAGNOSIS — F331 Major depressive disorder, recurrent, moderate: Secondary | ICD-10-CM | POA: Diagnosis not present

## 2019-11-15 DIAGNOSIS — G4700 Insomnia, unspecified: Secondary | ICD-10-CM

## 2019-11-15 MED ORDER — TRIAMCINOLONE ACETONIDE 0.5 % EX CREA: 1.0000 "application " | TOPICAL_CREAM | Freq: Two times a day (BID) | CUTANEOUS | 0 refills | Status: DC

## 2019-11-15 MED ORDER — FLUCONAZOLE 150 MG PO TABS: 150.0000 mg | ORAL_TABLET | ORAL | 0 refills | Status: DC | PRN

## 2019-11-15 MED ORDER — HYDROXYZINE PAMOATE 25 MG PO CAPS: 25.0000 mg | ORAL_CAPSULE | Freq: Every evening | ORAL | 2 refills | Status: DC | PRN

## 2019-11-15 MED ORDER — NYSTATIN 100000 UNIT/GM EX POWD: 1.0000 "application " | Freq: Two times a day (BID) | CUTANEOUS | 1 refills | Status: DC | PRN

## 2019-11-15 MED ORDER — SERTRALINE HCL 50 MG PO TABS: 50.0000 mg | ORAL_TABLET | Freq: Every day | ORAL | 3 refills | Status: DC

## 2019-11-15 MED ORDER — LEVOTHYROXINE SODIUM 100 MCG PO TABS: 100.0000 ug | ORAL_TABLET | Freq: Every day | ORAL | 3 refills | Status: DC

## 2019-11-15 NOTE — Patient Instructions (Signed)
Restart your over the counter vitamin D  Increase your zoloft dose at bedtime (50 mg)  Come back to get your labs done in about 2 months - before your appointment and we'll go over the results

## 2019-11-15 NOTE — Progress Notes (Signed)
Name: Tamara Walker   MRN: [id number]    DOB: May 14, 1985   Date:11/15/2019       Progress Note  Chief Complaint  Patient presents with  . Follow-up  . Hypothyroidism  . Hyperlipidemia  . Depression  . Insomnia  . Rash    under arm pits for 3 days     Subjective:   Tamara Walker is a 34 y.o. female, presents to clinic for   Hyperlipidemia: Currently treated with Crestor 5mg  qd, pt reports good med compliance Last Lipids: Lab Results  Component Value Date   CHOL 166 08/10/2019   HDL 66 08/10/2019   LDLCALC 80 08/10/2019   TRIG 106 08/10/2019   CHOLHDL 2.5 08/10/2019   - Denies: Chest pain, shortness of breath, myalgias, claudication  Recurrent rash to arm pits, scattered red bumps very irritated and red. She had similar about a month ago but it was much more severe, she was given a steroid taper and this did improve the rash and it did completely resolve but has recently started again.  It was more raw and red about a month ago, she has not changed soaps or detergents or deodorant.  She does have a history of profuse armpit sweating and a history of atopic dermatitis prone to rashes with dish soaps prone to rashes on her hands with dry weather and recurrent rash on her left ring finger which she needs to treat with triamcinolone.  She has not had any pruritic rash anywhere else to her body denies any yeast infection vaginally or 2 toes or nails.    Hypothyroidism: Current Medication Regimen: 100 mcg synthroid-she previously requested to increase in her dose after feeling poorly when her medicine was decreased from 100 to 88 mcg daily.   Her labs since increasing the dose again back to 100 mcg, labs remained within normal limits, she states that she does not feel any better though however and continues to feel fatigued Most recent results are below; we will not be repeating labs today - wait another 1-2 months to see if moods/energy improve Lab Results  Component Value Date    TSH 0.78 09/27/2019   Mood/insomnia -  She is not sure if she has been taking her medication she forgets to take BuSpar, takes hydroxyzine at bedtime and sometimes takes her sertraline but she has not noticed if anything is helping her mood anxiety irritation or insomnia.    Current Outpatient Medications:  .  busPIRone (BUSPAR) 5 MG tablet, Take 1-2 tablets (5-10 mg total) by mouth 2 (two) times daily as needed (anxiety/stress). (max dose 60 mg in 24 hours), Disp: 120 tablet, Rfl: 1 .  [START ON 02/07/2020] levothyroxine (SYNTHROID) 100 MCG tablet, Take 1 tablet (100 mcg total) by mouth daily before breakfast., Disp: 90 tablet, Rfl: 3 .  rosuvastatin (CRESTOR) 5 MG tablet, Take 1 tablet (5 mg total) by mouth daily., Disp: 90 tablet, Rfl: 3 .  sertraline (ZOLOFT) 50 MG tablet, Take 1 tablet (50 mg total) by mouth at bedtime., Disp: 90 tablet, Rfl: 3 .  fluconazole (DIFLUCAN) 150 MG tablet, Take 1 tablet (150 mg total) by mouth every 3 (three) days as needed (for vaginal itching/yeast infection sx)., Disp: 2 tablet, Rfl: 0 .  hydrOXYzine (VISTARIL) 25 MG capsule, Take 1-2 capsules (25-50 mg total) by mouth at bedtime as needed for anxiety (insomnia). For insomnia, Disp: 90 capsule, Rfl: 2 .  nystatin (NYSTATIN) powder, Apply 1 application topically 2 (two) times daily as needed.,  Disp: 60 g, Rfl: 1 .  triamcinolone cream (KENALOG) 0.5 %, Apply 1 application topically 2 (two) times daily., Disp: 30 g, Rfl: 0  Patient Active Problem List   Diagnosis Date Noted  . Mixed hyperlipidemia 05/21/2018  . Elevated glucose 05/20/2018  . Paternal family history of dementia 05/18/2018  . Family history of diabetes mellitus 05/18/2018  . Overweight (BMI 25.0-29.9) 05/18/2018  . Tobacco abuse 05/18/2018  . Microcytic erythrocytes 03/18/2017  . Vitamin D deficiency 11/08/2016  . Well woman exam without gynecological exam 05/03/2016  . Hashimoto's thyroiditis 10/31/2015  . Thyroid disease 01/03/2015  .  Thyroid nodule 01/03/2015    Past Surgical History:  Procedure Laterality Date  . CESAREAN SECTION    . EYE SURGERY    . TUBAL LIGATION  2016    Family History  Problem Relation Age of Onset  . Dementia Father 70       Died in his early 36's  . Heart attack Father 1       Died in his early 22's  . Diabetes Maternal Grandfather   . Diabetes Paternal Grandmother   . Dementia Paternal Grandmother 52  . Breast cancer Maternal Aunt   . Cancer Neg Hx   . Heart disease Neg Hx     Social History   Tobacco Use  . Smoking status: Current Every Day Smoker    Packs/day: 0.25    Years: 15.00    Pack years: 3.75    Types: Cigarettes  . Smokeless tobacco: Never Used  . Tobacco comment: 3-7 cigars a day  Vaping Use  . Vaping Use: Never used  Substance Use Topics  . Alcohol use: Yes    Alcohol/week: 0.0 standard drinks    Comment: occasional 1-2x/yr  . Drug use: No     No Known Allergies  Health Maintenance  Topic Date Due  . COVID-19 Vaccine (1) 12/01/2019 (Originally 06/18/1997)  . INFLUENZA VACCINE  06/01/2020 (Originally 10/03/2019)  . PAP-Cervical Cytology Screening  02/11/2022  . PAP SMEAR-Modifier  02/11/2022  . TETANUS/TDAP  05/04/2026  . Hepatitis C Screening  Completed  . HIV Screening  Completed    Chart Review Today: I personally reviewed active problem list, medication list, allergies, family history, social history, health maintenance, notes from last encounter, lab results, imaging with the patient/caregiver today.  Review of Systems  10 Systems reviewed and are negative for acute change except as noted in the HPI.  Objective:   Vitals:   11/15/19 1336  BP: 110/70  Pulse: 89  Resp: 16  Temp: 98.3 F (36.8 C)  TempSrc: Oral  SpO2: 99%  Weight: 146 lb 14.4 oz (66.6 kg)  Height: 5\' 1"  (1.549 m)    Body mass index is 27.76 kg/m.  Physical Exam Vitals and nursing note reviewed.  Constitutional:      General: She is not in acute distress.     Appearance: Normal appearance. She is well-developed. She is not ill-appearing, toxic-appearing or diaphoretic.     Interventions: Face mask in place.     Comments: Well-appearing, drinking a red bull in exam room  HENT:     Head: Normocephalic and atraumatic.     Right Ear: External ear normal.     Left Ear: External ear normal.  Eyes:     General: Lids are normal. No scleral icterus.       Right eye: No discharge.        Left eye: No discharge.     Conjunctiva/sclera:  Conjunctivae normal.  Neck:     Trachea: Phonation normal. No tracheal deviation.  Cardiovascular:     Rate and Rhythm: Normal rate and regular rhythm.     Pulses: Normal pulses.          Radial pulses are 2+ on the right side and 2+ on the left side.       Posterior tibial pulses are 2+ on the right side and 2+ on the left side.     Heart sounds: Normal heart sounds. No murmur heard.  No friction rub. No gallop.   Pulmonary:     Effort: Pulmonary effort is normal. No respiratory distress.     Breath sounds: Normal breath sounds. No stridor. No wheezing, rhonchi or rales.  Chest:     Chest wall: No tenderness.  Abdominal:     General: Bowel sounds are normal. There is no distension.     Palpations: Abdomen is soft.  Musculoskeletal:     Right lower leg: No edema.     Left lower leg: No edema.  Skin:    General: Skin is warm and dry.     Coloration: Skin is not jaundiced or pale.     Findings: Rash present.     Comments: Scattered erythematous papular rash to bilateral armpits  Neurological:     Mental Status: She is alert.     Motor: No abnormal muscle tone.     Gait: Gait normal.  Psychiatric:        Attention and Perception: Attention normal.        Mood and Affect: Mood normal.        Speech: Speech normal.        Behavior: Behavior is hyperactive. Behavior is cooperative.         Assessment & Plan:   1. Mixed hyperlipidemia Compliant with meds, no SE, no myalgias, fatigue or jaundice Diet  and exercise recommendations for HLD reviewed - will check CMP, last lipid at goal - will continue statin/same dose, recheck lipid panel annually - COMPLETE METABOLIC PANEL WITH GFR  2. Insomnia, unspecified type See #3 #5 below Encouraged to limit caffeine - hydrOXYzine (VISTARIL) 25 MG capsule; Take 1-2 capsules (25-50 mg total) by mouth at bedtime as needed for anxiety (insomnia). For insomnia  Dispense: 90 capsule; Refill: 2 - sertraline (ZOLOFT) 50 MG tablet; Take 1 tablet (50 mg total) by mouth at bedtime.  Dispense: 90 tablet; Refill: 3  3. Moderate episode of recurrent major depressive disorder (HCC) Mood overall is good the patient does seem slightly anxious and hyperactive Increase Zoloft dose, continue BuSpar as needed and hydroxyzine as needed for bedtime - sertraline (ZOLOFT) 50 MG tablet; Take 1 tablet (50 mg total) by mouth at bedtime.  Dispense: 90 tablet; Refill: 3  4. Hashimoto's thyroiditis Encourage the patient to work on daily med compliance, she does not feel clinically improved but her labs were within normal limits after increasing her dose will wait another month or 2 and recheck her labs again and check in with her on her symptoms - levothyroxine (SYNTHROID) 100 MCG tablet; Take 1 tablet (100 mcg total) by mouth daily before breakfast.  Dispense: 90 tablet; Refill: 3 - TSH + free T4  5. Anxiety Increase Zoloft dose daily at bedtime and patient can continue Vistaril as needed at bedtime - hydrOXYzine (VISTARIL) 25 MG capsule; Take 1-2 capsules (25-50 mg total) by mouth at bedtime as needed for anxiety (insomnia). For insomnia  Dispense: 90 capsule; Refill:  2 - sertraline (ZOLOFT) 50 MG tablet; Take 1 tablet (50 mg total) by mouth at bedtime.  Dispense: 90 tablet; Refill: 3  6. Rash and nonspecific skin eruption Suspect that she may have some intertrigo or allergic reaction to her deodorant encouraged her to try and stop deodorant or find scented skin or aluminum free  deodorant.  Try nystatin powder to the area, Diflucan with 2 doses and can use Kenalog sparingly, would like to avoid oral steroids unless the rash spreads  - nystatin (NYSTATIN) powder; Apply 1 application topically 2 (two) times daily as needed.  Dispense: 60 g; Refill: 1 - fluconazole (DIFLUCAN) 150 MG tablet; Take 1 tablet (150 mg total) by mouth every 3 (three) days as needed (for vaginal itching/yeast infection sx).  Dispense: 2 tablet; Refill: 0 - triamcinolone cream (KENALOG) 0.5 %; Apply 1 application topically 2 (two) times daily.  Dispense: 30 g; Refill: 0 - Ambulatory referral to Dermatology   Return in about 2 months (around 01/15/2020) for virtual f/up on thyroid/insomnia/anxiety and med changed.   Danelle Berry, PA-C 11/15/19 5:30 PM

## 2020-02-25 LAB — COMPLETE METABOLIC PANEL WITH GFR
AG Ratio: 2.1 (calc) (ref 1.0–2.5)
ALT: 14 U/L (ref 6–29)
AST: 14 U/L (ref 10–30)
Albumin: 4.5 g/dL (ref 3.6–5.1)
Alkaline phosphatase (APISO): 62 U/L (ref 31–125)
BUN: 8 mg/dL (ref 7–25)
CO2: 27 mmol/L (ref 20–32)
Calcium: 9.3 mg/dL (ref 8.6–10.2)
Chloride: 106 mmol/L (ref 98–110)
Creat: 0.71 mg/dL (ref 0.50–1.10)
GFR, Est African American: 129 mL/min/{1.73_m2} (ref >=60)
GFR, Est Non African American: 111 mL/min/{1.73_m2} (ref >=60)
Globulin: 2.1 g/dL (calc) (ref 1.9–3.7)
Glucose, Bld: 118 mg/dL — ABNORMAL HIGH (ref 65–99)
Potassium: 3.8 mmol/L (ref 3.5–5.3)
Sodium: 139 mmol/L (ref 135–146)
Total Bilirubin: 0.4 mg/dL (ref 0.2–1.2)
Total Protein: 6.6 g/dL (ref 6.1–8.1)

## 2020-02-25 LAB — TSH+FREE T4: TSH W/REFLEX TO FT4: 0.03 mIU/L — ABNORMAL LOW

## 2020-02-25 LAB — T4, FREE: Free T4: 2.1 ng/dL — ABNORMAL HIGH (ref 0.8–1.8)

## 2020-03-01 ENCOUNTER — Ambulatory Visit: Payer: BC Managed Care – PPO | Admitting: Dermatology

## 2020-03-07 ENCOUNTER — Other Ambulatory Visit: Payer: Self-pay

## 2020-03-07 ENCOUNTER — Ambulatory Visit: Payer: BC Managed Care – PPO | Admitting: Dermatology

## 2020-03-07 DIAGNOSIS — L74510 Primary focal hyperhidrosis, axilla: Secondary | ICD-10-CM | POA: Diagnosis not present

## 2020-03-07 DIAGNOSIS — L245 Irritant contact dermatitis due to other chemical products: Secondary | ICD-10-CM

## 2020-03-07 DIAGNOSIS — L74519 Primary focal hyperhidrosis, unspecified: Secondary | ICD-10-CM

## 2020-03-07 DIAGNOSIS — L235 Allergic contact dermatitis due to other chemical products: Secondary | ICD-10-CM

## 2020-03-07 MED ORDER — QBREXZA 2.4 % EX PADS: MEDICATED_PAD | CUTANEOUS | 2 refills | Status: DC

## 2020-03-07 MED ORDER — HYDROCORTISONE 2.5 % EX CREA: TOPICAL_CREAM | Freq: Two times a day (BID) | CUTANEOUS | 1 refills | Status: DC | PRN

## 2020-03-07 NOTE — Progress Notes (Addendum)
   New Patient Visit  Subjective  Tamara Walker is a 35 y.o. female who presents for the following: Skin Problem (Pt c/o excessive sweating at her axilla for several years, pt has tried all otc products with a poor response. Pt using otc secret deodorant ). Pt c/o rash beneath her arms from products she uses for deodorant.   The following portions of the chart were reviewed this encounter and updated as appropriate:   Tobacco  Allergies  Meds  Problems  Med Hx  Surg Hx  Fam Hx      Review of Systems:  No other skin or systemic complaints except as noted in HPI or Assessment and Plan.  Objective  Well appearing patient in no apparent distress; mood and affect are within normal limits.  A focused examination was performed including right axilla, left axilla . Relevant physical exam findings are noted in the Assessment and Plan.  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 Condition is significantly bothersome to patient. Currently flared.  Discussed treatment options including prescription glycopyrrolate tablets, Qbrexza.  Also reviewed OTC options including Odaban spray apply to axillae 2-3 times per week or Sweat block wipes 2-3 times per week  Recommend otc Odaban spray or Sweat block wipes 2-3 times per week plus OTC prescription strength antiperspirant  Start Qbrexza pads Apply one wipe to each axilla at bedtime wash hands after each use  Reviewed risk of difficulty sweating, dry mouth, dry eye, blurred vision, and urinary retention  Ordered Medications: Glycopyrronium Tosylate (QBREXZA) 2.4 % PADS  Irritant contact dermatitis due to other chemical products Right axilla, left axilla  History of irritant or allergic contact dermatitis to antiperspirants  If recurs, start hydrocortisone 2.5% cream twice a day for up to  2 weeks  Topical steroids (such as triamcinolone, fluocinolone, fluocinonide, mometasone, clobetasol, halobetasol, betamethasone, hydrocortisone) can cause thinning and lightening of the skin if they are used for too long in the same area. Your physician has selected the right strength medicine for your problem and area affected on the body. Please use your medication only as directed by your physician to prevent side effects.    Ordered Medications: hydrocortisone 2.5 % cream  Return in about 1 month (around 04/07/2020).  I, Angelique Holm, CMA, am acting as scribe for Darden Dates, MD .  Documentation: I have reviewed the above documentation for accuracy and completeness, and I agree with the above.  Darden Dates, MD

## 2020-03-21 ENCOUNTER — Ambulatory Visit: Payer: BC Managed Care – PPO | Admitting: Family Medicine

## 2020-03-28 ENCOUNTER — Encounter: Payer: Self-pay | Admitting: Dermatology

## 2020-04-07 ENCOUNTER — Encounter: Payer: Self-pay | Admitting: Family Medicine

## 2020-04-07 ENCOUNTER — Other Ambulatory Visit: Payer: Self-pay

## 2020-04-07 ENCOUNTER — Telehealth (INDEPENDENT_AMBULATORY_CARE_PROVIDER_SITE_OTHER): Payer: BC Managed Care – PPO | Admitting: Family Medicine

## 2020-04-07 ENCOUNTER — Ambulatory Visit: Payer: BC Managed Care – PPO | Admitting: Family Medicine

## 2020-04-07 DIAGNOSIS — G4700 Insomnia, unspecified: Secondary | ICD-10-CM | POA: Diagnosis not present

## 2020-04-07 DIAGNOSIS — Z5181 Encounter for therapeutic drug level monitoring: Secondary | ICD-10-CM

## 2020-04-07 DIAGNOSIS — F419 Anxiety disorder, unspecified: Secondary | ICD-10-CM

## 2020-04-07 DIAGNOSIS — E782 Mixed hyperlipidemia: Secondary | ICD-10-CM

## 2020-04-07 DIAGNOSIS — R202 Paresthesia of skin: Secondary | ICD-10-CM

## 2020-04-07 DIAGNOSIS — E063 Autoimmune thyroiditis: Secondary | ICD-10-CM

## 2020-04-07 DIAGNOSIS — D649 Anemia, unspecified: Secondary | ICD-10-CM

## 2020-04-07 DIAGNOSIS — R718 Other abnormality of red blood cells: Secondary | ICD-10-CM

## 2020-04-07 DIAGNOSIS — E559 Vitamin D deficiency, unspecified: Secondary | ICD-10-CM

## 2020-04-07 DIAGNOSIS — E039 Hypothyroidism, unspecified: Secondary | ICD-10-CM | POA: Diagnosis not present

## 2020-04-07 MED ORDER — LEVOTHYROXINE SODIUM 88 MCG PO TABS: 88.0000 ug | ORAL_TABLET | Freq: Every day | ORAL | 0 refills | Status: DC

## 2020-04-07 NOTE — Progress Notes (Signed)
Name: Tamara Walker   MRN: [id number]    DOB: [birth year]   Date:04/07/2020       Progress Note  Subjective:    Chief Complaint  Chief Complaint  Patient presents with  . Consult    Discuss labs    I connected with  Tamara Walker  on 04/07/20 at 10:40 AM EST by a video enabled telemedicine application and verified that I am speaking with the correct person using two identifiers.  I discussed the limitations of evaluation and management by telemedicine and the availability of in person appointments. The patient expressed understanding and agreed to proceed. Staff also discussed with the patient that there may be a patient responsible charge related to this service. Patient Location: home Provider Location: [facility] clinic office Additional Individuals present: none  HPI  Follow up on anxiety/depression sx and meds -  Depression screen [facility] 2/9 04/07/2020 11/15/2019 10/12/2019  Decreased Interest 0 0 0  Down, Depressed, Hopeless 0 0 0  PHQ - 2 Score 0 0 0  Altered sleeping 0 0 0  Tired, decreased energy 0 0 0  Change in appetite 0 0 0  Feeling bad or failure about yourself  0 0 0  Trouble concentrating 0 0 0  Moving slowly or fidgety/restless 0 0 0  Suicidal thoughts 0 0 0  PHQ-9 Score 0 0 0  Difficult doing work/chores Not difficult at all Not difficult at all Not difficult at all  Some recent data might be hidden   GAD 7 : Generalized Anxiety Score 04/07/2020 08/10/2019  Nervous, Anxious, on Edge 1 3  Control/stop worrying 0 2  Worry too much - different things 0 3  Trouble relaxing 1 3  Restless 0 2  Easily annoyed or irritable 1 2  Afraid - awful might happen 0 0  Total GAD 7 Score 3 15  Anxiety Difficulty Not difficult at all Somewhat difficult   Anxiety sx improved from last June per GAD screening although she reports no improvement in her sx  Taking buspar, zoloft and hydroxyzine   Chronic insomnia ongiong for many years, not much improvement with increasing zoloft and  taking with trazodone   Hypothyroidism: Current Medication Regimen:  mcg synthroid Takes medicine  Current Symptoms: denies fatigue, weight changes, heat/cold intolerance, bowel/skin changes  - anxiety and insomnia - chronic problems Most recent results are below; we will be repeating labs today. Lab Results  Component Value Date   TSH 0.78 09/27/2019     HLD -  Hyperlipidemia: Compliant with crestor, no se or concerns Last Lipids: Lab Results  Component Value Date   CHOL 166 08/10/2019   HDL 66 08/10/2019   LDLCALC 80 08/10/2019   TRIG 106 08/10/2019   CHOLHDL 2.5 08/10/2019   - Denies: Chest pain, shortness of breath, myalgias, claudication     Patient Active Problem List   Diagnosis Date Noted  . Mixed hyperlipidemia 05/21/2018  . Elevated glucose 05/20/2018  . Paternal family history of dementia 05/18/2018  . Family history of diabetes mellitus 05/18/2018  . Overweight (BMI 25.0-29.9) 05/18/2018  . Tobacco abuse 05/18/2018  . Microcytic erythrocytes 03/18/2017  . Vitamin D deficiency 11/08/2016  . Well woman exam without gynecological exam 05/03/2016  . Hashimoto's thyroiditis 10/31/2015  . Thyroid disease 01/03/2015  . Thyroid nodule 01/03/2015    Social History   Tobacco Use  . Smoking status: Current Every Day Smoker    Packs/day: 0.25    Years: 15.00    Pack years:  3.75    Types: Cigarettes  . Smokeless tobacco: Never Used  . Tobacco comment: 3-7 cigars a day  Substance Use Topics  . Alcohol use: Yes    Alcohol/week: 0.0 standard drinks    Comment: occasional 1-2x/yr     Current Outpatient Medications:  .  busPIRone (BUSPAR) 5 MG tablet, Take 1-2 tablets (5-10 mg total) by mouth 2 (two) times daily as needed (anxiety/stress). (max dose 60 mg in 24 hours), Disp: 120 tablet, Rfl: 1 .  Glycopyrronium Tosylate (QBREXZA) 2.4 % PADS, Apply one wipe to each axilla at bedtime wash hands after each use, Disp: 30 each, Rfl: 2 .  levothyroxine  (SYNTHROID) 100 MCG tablet, Take 1 tablet (100 mcg total) by mouth daily before breakfast., Disp: 90 tablet, Rfl: 3 .  rosuvastatin (CRESTOR) 5 MG tablet, Take 1 tablet (5 mg total) by mouth daily., Disp: 90 tablet, Rfl: 3 .  sertraline (ZOLOFT) 50 MG tablet, Take 1 tablet (50 mg total) by mouth at bedtime., Disp: 90 tablet, Rfl: 3 .  fluconazole (DIFLUCAN) 150 MG tablet, Take 1 tablet (150 mg total) by mouth every 3 (three) days as needed (for vaginal itching/yeast infection sx). (Patient not taking: Reported on 04/07/2020), Disp: 2 tablet, Rfl: 0 .  hydrocortisone 2.5 % cream, Apply topically 2 (two) times daily as needed (Rash). (Patient not taking: Reported on 04/07/2020), Disp: 30 g, Rfl: 1 .  hydrOXYzine (VISTARIL) 25 MG capsule, Take 1-2 capsules (25-50 mg total) by mouth at bedtime as needed for anxiety (insomnia). For insomnia (Patient not taking: Reported on 04/07/2020), Disp: 90 capsule, Rfl: 2 .  nystatin (NYSTATIN) powder, Apply 1 application topically 2 (two) times daily as needed. (Patient not taking: Reported on 04/07/2020), Disp: 60 g, Rfl: 1 .  triamcinolone cream (KENALOG) 0.5 %, Apply 1 application topically 2 (two) times daily. (Patient not taking: Reported on 04/07/2020), Disp: 30 g, Rfl: 0  No Known Allergies  I personally reviewed active problem list, medication list, allergies, family history, social history, health maintenance, notes from last encounter, lab results, imaging with the patient/caregiver today.   Review of Systems  Constitutional: Negative.   HENT: Negative.   Eyes: Negative.   Respiratory: Negative.   Cardiovascular: Negative.   Gastrointestinal: Negative.   Endocrine: Negative.   Genitourinary: Negative.   Musculoskeletal: Negative.   Skin: Negative.   Allergic/Immunologic: Negative.   Neurological: Negative.   Hematological: Negative.   Psychiatric/Behavioral: Negative.   All other systems reviewed and are negative.     Objective:   Virtual  encounter, vitals limited, only able to obtain the following There were no vitals filed for this visit. There is no height or weight on file to calculate BMI. Nursing Note and Vital Signs reviewed.  Physical Exam Vitals and nursing note reviewed.  Constitutional:      General: She is not in acute distress.    Appearance: She is not ill-appearing, toxic-appearing or diaphoretic.  Neurological:     Mental Status: She is alert.  Psychiatric:        Mood and Affect: Mood normal.        Behavior: Behavior normal.     PE limited by telephone encounter  No results found for this or any previous visit (from the past 72 hour(s)).  Assessment and Plan:     ICD-10-CM   1. Hypothyroidism, unspecified type  E03.9 TSH    T4, free   dose increased last July and rechecked labs were normal, recently checked again TSH  was low T4 high, decrease dose from 100 mcg to 88 mcg 6 wk f/up  2. Insomnia, unspecified type  G47.00 TSH    T4, free   chronic, fairly severe, not improving - will recheck with lowering thyroid med dose - if still bothersome then recommend psych/specialists eval  3. Anxiety  F41.9    GAD scores improved significantly though pt endorses she does not feel much better - zoloft dose increased, con't same with buspar and vistaril prn  4. Medication monitoring encounter  Z51.81 CBC with Differential/Platelet    COMPLETE METABOLIC PANEL WITH GFR    Lipid panel    Hemoglobin A1c    TSH    Iron, TIBC and Ferritin Panel    T4, free    VITAMIN D 25 Hydroxy (Vit-D Deficiency, Fractures)  5. Mixed hyperlipidemia  E78.2 COMPLETE METABOLIC PANEL WITH GFR    Lipid panel   good statin compliance, no SE or concerns   6. Hashimoto's thyroiditis  E06.3 TSH    T4, free  7. Anemia, unspecified type  D64.9 CBC with Differential/Platelet    Iron, TIBC and Ferritin Panel  8. Vitamin D deficiency  E55.9 COMPLETE METABOLIC PANEL WITH GFR    VITAMIN D 25 Hydroxy (Vit-D Deficiency, Fractures)  9.  Paresthesia of lower extremity  R20.2 CBC with Differential/Platelet    COMPLETE METABOLIC PANEL WITH GFR    Hemoglobin A1c    TSH    Iron, TIBC and Ferritin Panel    T4, free    VITAMIN D 25 Hydroxy (Vit-D Deficiency, Fractures)  10. Microcytosis  R71.8 CBC with Differential/Platelet    TSH    Iron, TIBC and Ferritin Panel     In person f/up on thyroid, anxiety, insomnia For now we will decrease levothyroxine dose That may improve some of her anxiety and insomnia sx? Keep other meds the same - zoloft 50, hydroxyzine and buspar prn   Re-eval in 6-8 weeks - adjust meds as needed  Recommended endocrine refill if difficulty with getting thyroid labs/meds stable over the next 3 months  Recommend psych eval for long standing insomnia and anxiety - likely multifactorial and specialists eval and therapy would be helpful  -Red flags and when to present for emergency care or RTC including fever >101.43F, chest pain, shortness of breath, new/worsening/un-resolving symptoms, reviewed with patient at time of visit. Follow up and care instructions discussed and provided in AVS. - I discussed the assessment and treatment plan with the patient. The patient was provided an opportunity to ask questions and all were answered. The patient agreed with the plan and demonstrated an understanding of the instructions.  Return for  f/up in office appt in 6-8 weeks HTN, HLD, anemia, anxiety - complete labs prior to appt.    I provided 30+ minutes of non-face-to-face time during this encounter.  Danelle Berry, PA-C 04/07/20 11:16 AM

## 2020-04-11 ENCOUNTER — Encounter: Payer: Self-pay | Admitting: Dermatology

## 2020-04-11 ENCOUNTER — Telehealth (INDEPENDENT_AMBULATORY_CARE_PROVIDER_SITE_OTHER): Payer: BC Managed Care – PPO | Admitting: Dermatology

## 2020-04-11 ENCOUNTER — Ambulatory Visit: Payer: BC Managed Care – PPO | Admitting: Dermatology

## 2020-04-11 DIAGNOSIS — L750 Bromhidrosis: Secondary | ICD-10-CM | POA: Diagnosis not present

## 2020-04-11 DIAGNOSIS — L74510 Primary focal hyperhidrosis, axilla: Secondary | ICD-10-CM

## 2020-04-11 DIAGNOSIS — L74519 Primary focal hyperhidrosis, unspecified: Secondary | ICD-10-CM

## 2020-04-11 NOTE — Progress Notes (Signed)
   Follow-Up Visit   Subjective  Tamara Walker is a 35 y.o. female who presents for the following: Follow-up (Patient presents for 4 week hyperhidrosis follow up. She has been using Qbrexa pads at axillae but advises the sweating and odor has worsened. ).  I connected with  Asencion Noble on 04/11/20 by a video enabled telemedicine application and verified that I am speaking with the correct person using two identifiers.   I discussed the limitations of evaluation and management by telemedicine. The patient expressed understanding and agreed to proceed. She understands she will be billed as for an in person visit.   The following portions of the chart were reviewed this encounter and updated as appropriate:   Tobacco  Allergies  Meds  Problems  Med Hx  Surg Hx  Fam Hx      Review of Systems:  No other skin or systemic complaints except as noted in HPI or Assessment and Plan.  Objective  Well appearing patient in no apparent distress; mood and affect are within normal limits.  A focused examination was performed including face. Relevant physical exam findings are noted in the Assessment and Plan.    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 Condition is bothersome to patient. Currently flared.  Failed Qbrexza (no improvement, worsened sweating and odor)  Start glycopyrrolate 1 tablet daily. After 3 days, if still sweating and no side effects, increase to twice a day. After 3 more days, if still sweating and no side effects, increase to 3 times a day.   After 3 more days, if still sweating and no side effects, increase to two tablets in the morning, one tablet at lunch and one tablet in the evening. After 3 more days, if still sweating and no side effects, increase to two tablets in the morning, one at lunch, and two in the evening.  Glycopyrrolate can significantly increase the risk of heat stroke so you should  avoid using it in the heat, particularly while active. It can also cause dry mouth, blurred vision, difficulty with urination, headache, constipation, and racing heart. Take it only as directed. Never take more than 8 tablets total per day.  Instructions sent to patient via MyChart Message  If she fails glycopyrrolate, will submit for botox approval  Other Related Medications Glycopyrronium Tosylate (QBREXZA) 2.4 % PADS  Bromhidrosis Right Axilla  Pt reports odor at axillae  Start clindamycin 1% solution once to twice daily to axillae   Patient located in [place], [place] at time of visit.  Patient is aware that insurance will be billed for this appointment.  [facility] is located in [place], [place] Patient: Home  Provider: Office     Glycopyrrolate can significantly increase the risk of heat stroke so you should avoid using it in the heat, particularly while active. It can also cause dry mouth, blurred vision, difficulty with urination, headache, constipation, and racing heart. Take it only as directed. Never take more than 8 tablets total per day.  Total duration of video visit 23 minutes.  Return in about 6 weeks (around 05/23/2020).  Anise Salvo, RMA, am acting as scribe for Darden Dates, MD .  Documentation: I have reviewed the above documentation for accuracy and completeness, and I agree with the above.  Darden Dates, MD

## 2020-04-19 ENCOUNTER — Other Ambulatory Visit: Payer: Self-pay

## 2020-04-19 DIAGNOSIS — L74519 Primary focal hyperhidrosis, unspecified: Secondary | ICD-10-CM

## 2020-04-19 MED ORDER — GLYCOPYRROLATE 1 MG PO TABS: ORAL_TABLET | ORAL | 1 refills | Status: DC

## 2020-05-23 ENCOUNTER — Telehealth: Payer: BC Managed Care – PPO | Admitting: Dermatology

## 2020-05-23 NOTE — Patient Instructions (Incomplete)

## 2020-05-24 ENCOUNTER — Other Ambulatory Visit: Payer: Self-pay

## 2020-05-24 ENCOUNTER — Ambulatory Visit (INDEPENDENT_AMBULATORY_CARE_PROVIDER_SITE_OTHER): Payer: BC Managed Care – PPO | Admitting: Dermatology

## 2020-05-24 ENCOUNTER — Encounter: Payer: Self-pay | Admitting: Dermatology

## 2020-05-24 DIAGNOSIS — L74512 Primary focal hyperhidrosis, palms: Secondary | ICD-10-CM

## 2020-05-24 DIAGNOSIS — L74510 Primary focal hyperhidrosis, axilla: Secondary | ICD-10-CM | POA: Diagnosis not present

## 2020-05-24 DIAGNOSIS — L74513 Primary focal hyperhidrosis, soles: Secondary | ICD-10-CM

## 2020-05-24 DIAGNOSIS — L74519 Primary focal hyperhidrosis, unspecified: Secondary | ICD-10-CM

## 2020-05-24 NOTE — Patient Instructions (Signed)
Will submit to see if botox will be covered for axillae, hands, and feet after labs done. Discussed possibly will not be covered until thyroid under control.  Advised of possible side effect of decrease in grip strength. Would need to have done every 3 months.   For Hyperhidrosis (Excess Sweating) - Recommend trial of SweatBlock wipes start twice per week. Use more often if needed to stop sweating. Use less often (once per week) if causing irritation.   Recommend starting Metamucil powder for constipation. If not improved after a week pt will call or send mychart message.   Increase glycopyrrolate to 4 tabs/day for 3 days, 5 tabs/day for 3 days, 6 tabs/day for 3 days as long as still having excess sweating and not experiencing problematic side effects. Will monitor constipation as this is chronic.  Glycopyrrolate can significantly increase the risk of heat stroke so you should avoid using it in the heat, particularly while active. It can also cause dry mouth, blurred vision, difficulty with urination, headache, constipation, and racing heart. Take it only as directed. Never take more than 8 tablets total per day.

## 2020-05-24 NOTE — Progress Notes (Signed)
   Follow-Up Visit   Subjective  Tamara Walker is a 35 y.o. female who presents for the following: Excessive Sweating (Pt was treating with Glycopyrrolate 1 mg TID. Pt states that it did seem to help the axillae but she was experiencing more sweating at the hands and feet. Pt also reports more constipation than usual with bowel movements only 1-2 times per week. States that she stopped taking glycopyrrolate due to this).  The following portions of the chart were reviewed this encounter and updated as appropriate:  Tobacco  Allergies  Meds  Problems  Med Hx  Surg Hx  Fam Hx      Review of Systems: No other skin or systemic complaints except as noted in HPI or Assessment and Plan.   Objective  Well appearing patient in no apparent distress; mood and affect are within normal limits.  A focused examination was performed including hands, feet, axillae. Relevant physical exam findings are noted in the Assessment and Plan.  Objective  hands, feet, axillae: Excessive sweat  Assessment & Plan  Primary focal hyperhidrosis hands, feet, axillae  Chronic condition with duration over one year. Condition is bothersome to patient. Currently flared.  Pt with side effect of glycopyrrolate (increased constipation) at 1 mg tablet three times daily.  Affecting palms, soles, and axillae. Worsened lately despite glycopyrrolate.   Pt with hyperthyroidism, being treated. Due for repeat labs next week. Reviewed last TSH, T4.  Will submit to see if botox will be covered for axillae, hands, and feet after labs done once thyroid disease confirmed under control.   Advised of possible side effect of botox of decrease in grip strength. Would need to have done every 3 months.   Recommend trial of SweatBlock wipes start twice per week. Use more often if needed to stop sweating. Use less often (once per week) if causing irritation.   Recommend starting Metamucil powder for constipation. If not improved  after a week pt will call or send mychart message.   Restart glycopyrrolate - increasing to tabs/day for 3 days, then 5 tabs/day for 3 days, then 6 tabs/day for 3 days as long as still having excess sweating and not experiencing problematic side effects. Will monitor constipation as this is chronic.  Glycopyrrolate can significantly increase the risk of heat stroke so you should avoid using it in the heat, particularly while active. It can also cause dry mouth, blurred vision, difficulty with urination, headache, constipation, and racing heart. Take it only as directed. Never take more than 8 tablets total per day.   Other Related Medications Glycopyrronium Tosylate (QBREXZA) 2.4 % PADS glycopyrrolate (ROBINUL) 1 MG tablet  Return in about 1 month (around 06/24/2020) for hyperhidrosis.   I, Epifania Gore, CMA, am acting as scribe for Darden Dates, MD.   Documentation: I have reviewed the above documentation for accuracy and completeness, and I agree with the above.  Darden Dates, MD

## 2020-05-26 ENCOUNTER — Encounter: Payer: Self-pay | Admitting: Dermatology

## 2020-05-26 ENCOUNTER — Ambulatory Visit: Payer: BC Managed Care – PPO | Admitting: Family Medicine

## 2020-05-31 ENCOUNTER — Ambulatory Visit (INDEPENDENT_AMBULATORY_CARE_PROVIDER_SITE_OTHER): Payer: BC Managed Care – PPO | Admitting: Physician Assistant

## 2020-05-31 ENCOUNTER — Other Ambulatory Visit: Payer: Self-pay

## 2020-05-31 ENCOUNTER — Encounter: Payer: Self-pay | Admitting: Physician Assistant

## 2020-05-31 VITALS — BP 120/80 | HR 74 | Temp 98.1°F | Resp 16 | Ht 61.0 in | Wt 124.8 lb

## 2020-05-31 DIAGNOSIS — E063 Autoimmune thyroiditis: Secondary | ICD-10-CM | POA: Diagnosis not present

## 2020-05-31 DIAGNOSIS — E041 Nontoxic single thyroid nodule: Secondary | ICD-10-CM | POA: Diagnosis not present

## 2020-05-31 DIAGNOSIS — F331 Major depressive disorder, recurrent, moderate: Secondary | ICD-10-CM | POA: Diagnosis not present

## 2020-05-31 MED ORDER — SERTRALINE HCL 100 MG PO TABS: 100.0000 mg | ORAL_TABLET | Freq: Every day | ORAL | 0 refills | Status: DC

## 2020-05-31 NOTE — Progress Notes (Signed)
Established patient visit   Patient: Tamara Walker   DOB: 1985/12/30   35 y.o. Female  MRN: [id number] Visit Date: 05/31/2020  Today's healthcare provider: Trey Sailors, PA-C   Chief Complaint  Patient presents with  . Follow-up  . Hypothyroidism  . Anxiety  . Hyperlipidemia   Subjective    HPI   Hypothyroid, follow-up  Lab Results  Component Value Date   TSH 2.51 05/31/2020   TSH 0.78 09/27/2019   TSH 2.18 08/10/2019   FREET4 1.2 05/31/2020   FREET4 2.1 (H) 02/24/2020   Wt Readings from Last 3 Encounters:  05/31/20 124 lb 12.8 oz (56.6 kg)  11/15/19 146 lb 14.4 oz (66.6 kg)  10/12/19 148 lb 4.8 oz (67.3 kg)    She was last seen for hypothyroid 4 months ago.  Management since that visit includes decreasing synthroid to 88 mcg daily. She reports excellent compliance with treatment. She is not having side effects.   Symptoms: Yes change in energy level No constipation  No diarrhea No heat / cold intolerance  Yes nervousness No palpitations  No weight changes    ----------------------------------------------------------------------------------------- Anxiety, Follow-up  She was last seen for anxiety 4 months ago. Changes made at last visit include continue buspar and zoloft.   She reports good compliance with treatment. She reports good tolerance of treatment. She is not having side effects.   She feels her anxiety is moderate and Worse since last visit.  Symptoms: No chest pain No difficulty concentrating  No dizziness No fatigue  No feelings of losing control No insomnia  Yes irritable No palpitations  No panic attacks No racing thoughts  No shortness of breath No sweating  No tremors/shakes    GAD-7 Results GAD-7 Generalized Anxiety Disorder Screening Tool 05/31/2020 04/07/2020 08/10/2019  1. Feeling Nervous, Anxious, or on Edge 3 1 3   2. Not Being Able to Stop or Control Worrying 2 0 2  3. Worrying Too Much About Different Things 3 0 3   4. Trouble Relaxing 3 1 3   5. Being So Restless it's Hard To Sit Still 3 0 2  6. Becoming Easily Annoyed or Irritable 3 1 2   7. Feeling Afraid As If Something Awful Might Happen 0 0 0  Total GAD-7 Score 17 3 15   Difficulty At Work, Home, or Getting  Along With Others? Somewhat difficult Not difficult at all Somewhat difficult    PHQ-9 Scores PHQ9 SCORE ONLY 05/31/2020 04/07/2020 11/15/2019  PHQ-9 Total Score 12 0 0    ---------------------------------------------------------------------------------------------------      Medications: Outpatient Medications Prior to Visit  Medication Sig  . busPIRone (BUSPAR) 5 MG tablet Take 1-2 tablets (5-10 mg total) by mouth 2 (two) times daily as needed (anxiety/stress). (max dose 60 mg in 24 hours)  . hydrOXYzine (VISTARIL) 25 MG capsule Take 1-2 capsules (25-50 mg total) by mouth at bedtime as needed for anxiety (insomnia). For insomnia  . levothyroxine (SYNTHROID) 88 MCG tablet Take 1 tablet (88 mcg total) by mouth daily.  . rosuvastatin (CRESTOR) 5 MG tablet Take 1 tablet (5 mg total) by mouth daily.  . [DISCONTINUED] glycopyrrolate (ROBINUL) 1 MG tablet Start glycopyrrolate 1 mg daily for 3 days. If you do not have side effects and are still having excess sweating, increase by 1 mg per day every 3 days up to a maximum of 5 tablets in one day, dividing the pills through the day as directed.  . [DISCONTINUED] Glycopyrronium Tosylate (QBREXZA) 2.4 %  PADS Apply one wipe to each axilla at bedtime wash hands after each use  . [DISCONTINUED] sertraline (ZOLOFT) 50 MG tablet Take 1 tablet (50 mg total) by mouth at bedtime.   No facility-administered medications prior to visit.    Review of Systems     Objective    BP 120/80   Pulse 74   Temp 98.1 F (36.7 C)   Resp 16   Ht 5\' 1"  (1.549 m)   Wt 124 lb 12.8 oz (56.6 kg)   LMP 05/01/2020   SpO2 99%   BMI 23.58 kg/m     Physical Exam Neck:     Thyroid: Thyromegaly present.   Cardiovascular:     Rate and Rhythm: Normal rate.     Heart sounds: Normal heart sounds.  Pulmonary:     Effort: Pulmonary effort is normal.     Breath sounds: Normal breath sounds.  Skin:    General: Skin is warm and dry.  Neurological:     Mental Status: She is oriented to person, place, and time. Mental status is at baseline.  Psychiatric:        Mood and Affect: Mood normal.        Behavior: Behavior normal.       No results found for any visits on 05/31/20.  Assessment & Plan    1. Hashimoto's thyroiditis  06/02/20 2020 with one 1.2 cm nodule, recommended follow up in one year. Will get follow up ultrasound today. Check TSH after synthroid adjustment. F/u TBD based on lab results and imaging.   - 2021 THYROID; Future - sertraline (ZOLOFT) 100 MG tablet; Take 1 tablet (100 mg total) by mouth daily.  Dispense: 90 tablet; Refill: 0  2. Thyroid nodule  - US THYROID; Future  3. Moderate episode of recurrent major depressive disorder (HCC)  Declines counseling today. Will increase zoloft to 100 mg daily for better control of mood symptoms.    No follow-ups on file.         [place], PA-C  [facility] [phone] (phone) [phone] (fax)  [facility]

## 2020-05-31 NOTE — Patient Instructions (Signed)
Goiter  A goiter is an enlarged thyroid gland. The thyroid is located in the lower front of the neck. It makes hormones that affect many body parts and systems, including the system that affects how quickly the body burns fuel for energy (metabolism). Most goiters are painless and are not a cause for concern. Some goiters can affect the way your thyroid makes thyroid hormones. Goiters and conditions that cause goiters can be treated, if necessary. What are the causes? Common causes of this condition include:  Lack (deficiency) of a mineral called iodine. The thyroid gland uses iodine to make thyroid hormones.  Diseases that attack healthy cells in the body (autoimmune diseases) and affect thyroid function, such as Graves' disease or Hashimoto's disease. These diseases may cause the body to produce too much thyroid hormone (hyperthyroidism) or too little of the hormone (hypothyroidism).  Conditions that cause inflammation of the thyroid (thyroiditis).  One or more small growths on the thyroid (nodular goiter). Other causes include:  Medical problems caused by abnormal genes that are passed from parent to child (genetic defects).  Thyroid injury or infection.  Tumors that may or may not be cancerous.  Pregnancy.  Certain medicines.  Exposure to radiation. In some cases, the cause may not be known. What increases the risk? This condition is more likely to develop in:  People who do not get enough iodine in their diet.  People who have a family history of goiter.  Women.  People who are older than age 40.  People who smoke tobacco.  People who have had exposure to radiation. What are the signs or symptoms? The main symptom of this condition is swelling in the lower, front part of the neck. This swelling can range from a very small bump to a large lump. Other symptoms may include:  A tight feeling in the throat.  A hoarse voice.  Coughing.  Wheezing.  Difficulty  swallowing or breathing.  Bulging veins in the neck.  Dizziness. When a goiter is the result of an overactive thyroid (hyperthyroidism), symptoms may also include:  Nervousness or restlessness.  Inability to tolerate heat.  Unexplained weight loss.  Diarrhea.  Change in the texture of hair or skin.  Changes in heartbeat, such as skipped beats, extra beats, or a rapid heart rate.  Loss of menstruation.  Shaky hands.  Increased appetite.  Sleep problems. When a goiter is the result of an underactive thyroid (hypothyroidism), symptoms may also include:  Feeling like you have no energy (lethargy).  Inability to tolerate cold.  Weight gain that is not explained by a change in diet or exercise habits.  Dry skin.  Coarse hair.  Irregular menstrual periods.  Constipation.  Sadness or depression.  Fatigue. In some cases, there may not be any symptoms and the thyroid hormone levels may be normal.   How is this diagnosed? This condition may be diagnosed based on your symptoms, your medical history, and a physical exam. You may have tests, such as:  Blood tests to check thyroid function.  Imaging tests, such as: ? Ultrasound. ? CT scan. ? MRI. ? Thyroid scan.  Removal of a tissue sample (biopsy) of the goiter or any nodules. The sample will be tested to check for cancer. How is this treated? Treatment for this condition depends on the cause and your symptoms. Treatment may include:  Medicines to regulate thyroid hormone levels.  Anti-inflammatory medicines or steroid medicines, if the goiter is caused by inflammation.  Iodine supplements or changes   to your diet, if the goiter is caused by iodine deficiency.  Radioactive iodine treatment.  Surgery to remove your thyroid. In some cases, you may only need regular check-ups with your health care provider to monitor your condition, and you may not need treatment. Follow these instructions at home:  Follow  instructions from your health care provider about any changes to your diet.  Take over-the-counter and prescription medicines only as told by your health care provider. These include supplements.  Do not use any products that contain nicotine or tobacco, such as cigarettes and e-cigarettes. If you need help quitting, ask your health care provider.  Keep all follow-up visits as told by your health care provider. This is important. Contact a health care provider if:  Your symptoms do not get better with treatment.  You have nausea, vomiting, or diarrhea. Get help right away if:  You have sudden, unexplained confusion or other mental changes.  You have a fever.  You have chest pain.  You have trouble breathing or swallowing.  You suddenly become very weak.  You experience extreme restlessness.  You feel your heart racing. Summary  A goiter is an enlarged thyroid gland.  The thyroid gland is located in the lower front of the neck. It makes hormones that affect many body parts and systems, including the system that affects how quickly the body burns fuel for energy (metabolism).  The main symptom of this condition is swelling in the lower, front part of the neck. This swelling can range from a very small bump to a large lump.  Treatment for this condition depends on the cause and your symptoms. You may need medicines, supplements, or regular monitoring of your condition. This information is not intended to replace advice given to you by your health care provider. Make sure you discuss any questions you have with your health care provider. Document Revised: 10/28/2019 Document Reviewed: 10/28/2019 Elsevier Patient Education  2021 [employer]  

## 2020-06-01 DIAGNOSIS — F331 Major depressive disorder, recurrent, moderate: Secondary | ICD-10-CM | POA: Insufficient documentation

## 2020-06-02 LAB — CBC WITH DIFFERENTIAL/PLATELET
Absolute Monocytes: 409 cells/uL (ref 200–950)
Basophils Absolute: 20 cells/uL (ref 0–200)
Basophils Relative: 0.3 %
Eosinophils Absolute: 79 cells/uL (ref 15–500)
Eosinophils Relative: 1.2 %
HCT: 40.1 % (ref 35.0–45.0)
Hemoglobin: 12.1 g/dL (ref 11.7–15.5)
Lymphs Abs: 2277 cells/uL (ref 850–3900)
MCH: 22.5 pg — ABNORMAL LOW (ref 27.0–33.0)
MCHC: 30.2 g/dL — ABNORMAL LOW (ref 32.0–36.0)
MCV: 74.7 fL — ABNORMAL LOW (ref 80.0–100.0)
MPV: 10 fL (ref 7.5–12.5)
Monocytes Relative: 6.2 %
Neutro Abs: 3815 cells/uL (ref 1500–7800)
Neutrophils Relative %: 57.8 %
Platelets: 260 10*3/uL (ref 140–400)
RBC: 5.37 10*6/uL — ABNORMAL HIGH (ref 3.80–5.10)
RDW: 13.1 % (ref 11.0–15.0)
Total Lymphocyte: 34.5 %
WBC: 6.6 10*3/uL (ref 3.8–10.8)

## 2020-06-02 LAB — LIPID PANEL
Cholesterol: 137 mg/dL (ref <200)
HDL: 52 mg/dL (ref >=50)
LDL Cholesterol (Calc): 72 mg/dL (calc)
Non-HDL Cholesterol (Calc): 85 mg/dL (calc) (ref <130)
Total CHOL/HDL Ratio: 2.6 (calc) (ref <5.0)
Triglycerides: 58 mg/dL (ref <150)

## 2020-06-02 LAB — COMPLETE METABOLIC PANEL WITH GFR
AG Ratio: 2.1 (calc) (ref 1.0–2.5)
ALT: 11 U/L (ref 6–29)
AST: 14 U/L (ref 10–30)
Albumin: 4.7 g/dL (ref 3.6–5.1)
Alkaline phosphatase (APISO): 79 U/L (ref 31–125)
BUN: 9 mg/dL (ref 7–25)
CO2: 28 mmol/L (ref 20–32)
Calcium: 9.4 mg/dL (ref 8.6–10.2)
Chloride: 106 mmol/L (ref 98–110)
Creat: 0.71 mg/dL (ref 0.50–1.10)
GFR, Est African American: 129 mL/min/{1.73_m2} (ref >=60)
GFR, Est Non African American: 111 mL/min/{1.73_m2} (ref >=60)
Globulin: 2.2 g/dL (calc) (ref 1.9–3.7)
Glucose, Bld: 92 mg/dL (ref 65–99)
Potassium: 4 mmol/L (ref 3.5–5.3)
Sodium: 139 mmol/L (ref 135–146)
Total Bilirubin: 0.4 mg/dL (ref 0.2–1.2)
Total Protein: 6.9 g/dL (ref 6.1–8.1)

## 2020-06-02 LAB — T4, FREE: Free T4: 1.2 ng/dL (ref 0.8–1.8)

## 2020-06-02 LAB — IRON,TIBC AND FERRITIN PANEL
%SAT: 17 % (calc) (ref 16–45)
Ferritin: 46 ng/mL (ref 16–154)
Iron: 51 ug/dL (ref 40–190)
TIBC: 306 mcg/dL (calc) (ref 250–450)

## 2020-06-02 LAB — HEMOGLOBIN A1C
Hgb A1c MFr Bld: 5.5 % of total Hgb (ref <5.7)
Mean Plasma Glucose: 111 mg/dL
eAG (mmol/L): 6.2 mmol/L

## 2020-06-02 LAB — VITAMIN D 25 HYDROXY (VIT D DEFICIENCY, FRACTURES): Vit D, 25-Hydroxy: 24 ng/mL — ABNORMAL LOW (ref 30–100)

## 2020-06-02 LAB — TSH: TSH: 2.51 mIU/L

## 2020-06-27 ENCOUNTER — Ambulatory Visit: Payer: BC Managed Care – PPO | Admitting: Dermatology

## 2020-06-30 ENCOUNTER — Other Ambulatory Visit: Payer: Self-pay

## 2020-06-30 ENCOUNTER — Ambulatory Visit
Admission: RE | Admit: 2020-06-30 | Discharge: 2020-06-30 | Disposition: A | Discharge status: Home / Self Care | Payer: BC Managed Care – PPO | Source: Ambulatory Visit | Attending: Physician Assistant | Admitting: Physician Assistant

## 2020-06-30 DIAGNOSIS — E041 Nontoxic single thyroid nodule: Secondary | ICD-10-CM | POA: Insufficient documentation

## 2020-06-30 DIAGNOSIS — E063 Autoimmune thyroiditis: Secondary | ICD-10-CM | POA: Diagnosis not present

## 2020-07-10 ENCOUNTER — Other Ambulatory Visit: Payer: Self-pay

## 2020-07-10 ENCOUNTER — Encounter: Payer: Self-pay | Admitting: Family Medicine

## 2020-07-10 DIAGNOSIS — E782 Mixed hyperlipidemia: Secondary | ICD-10-CM

## 2020-07-11 MED ORDER — ROSUVASTATIN CALCIUM 5 MG PO TABS: 5.0000 mg | ORAL_TABLET | Freq: Every day | ORAL | 3 refills | Status: DC

## 2020-07-24 ENCOUNTER — Other Ambulatory Visit: Payer: Self-pay

## 2020-07-24 ENCOUNTER — Encounter: Payer: Self-pay | Admitting: Family Medicine

## 2020-07-24 ENCOUNTER — Other Ambulatory Visit: Payer: Self-pay | Admitting: Family Medicine

## 2020-07-24 MED ORDER — LEVOTHYROXINE SODIUM 88 MCG PO TABS: 88.0000 ug | ORAL_TABLET | Freq: Every day | ORAL | 0 refills | Status: DC

## 2020-07-25 MED ORDER — BUSPIRONE HCL 7.5 MG PO TABS: 7.5000 mg | ORAL_TABLET | Freq: Two times a day (BID) | ORAL | 2 refills | Status: DC | PRN

## 2020-08-14 ENCOUNTER — Other Ambulatory Visit: Payer: Self-pay | Admitting: Family Medicine

## 2020-08-14 MED ORDER — BUSPIRONE HCL 10 MG PO TABS: 10.0000 mg | ORAL_TABLET | Freq: Three times a day (TID) | ORAL | 2 refills | Status: DC | PRN

## 2020-08-14 MED ORDER — LEVOTHYROXINE SODIUM 88 MCG PO TABS: 88.0000 ug | ORAL_TABLET | Freq: Every day | ORAL | 3 refills | Status: DC

## 2020-10-18 LAB — LIPID PANEL
Cholesterol: 165 mg/dL (ref <200)
HDL: 75 mg/dL (ref >=50)
LDL Cholesterol (Calc): 76 mg/dL (calc)
Non-HDL Cholesterol (Calc): 90 mg/dL (calc) (ref <130)
Total CHOL/HDL Ratio: 2.2 (calc) (ref <5.0)
Triglycerides: 55 mg/dL (ref <150)

## 2020-10-18 LAB — HEMOGLOBIN A1C
Hgb A1c MFr Bld: 5.4 % of total Hgb (ref <5.7)
Mean Plasma Glucose: 108 mg/dL
eAG (mmol/L): 6 mmol/L

## 2020-10-18 LAB — COMPLETE METABOLIC PANEL WITH GFR
AG Ratio: 2 (calc) (ref 1.0–2.5)
ALT: 13 U/L (ref 6–29)
AST: 18 U/L (ref 10–30)
Albumin: 4.8 g/dL (ref 3.6–5.1)
Alkaline phosphatase (APISO): 88 U/L (ref 31–125)
BUN: 7 mg/dL (ref 7–25)
CO2: 28 mmol/L (ref 20–32)
Calcium: 9.3 mg/dL (ref 8.6–10.2)
Chloride: 104 mmol/L (ref 98–110)
Creat: 0.82 mg/dL (ref 0.50–0.97)
Globulin: 2.4 g/dL (calc) (ref 1.9–3.7)
Glucose, Bld: 126 mg/dL — ABNORMAL HIGH (ref 65–99)
Potassium: 3.8 mmol/L (ref 3.5–5.3)
Sodium: 138 mmol/L (ref 135–146)
Total Bilirubin: 0.7 mg/dL (ref 0.2–1.2)
Total Protein: 7.2 g/dL (ref 6.1–8.1)
eGFR: 96 mL/min/{1.73_m2} (ref >=60)

## 2020-10-18 LAB — IRON,TIBC AND FERRITIN PANEL
%SAT: 38 % (calc) (ref 16–45)
Ferritin: 71 ng/mL (ref 16–154)
Iron: 128 ug/dL (ref 40–190)
TIBC: 340 mcg/dL (calc) (ref 250–450)

## 2020-10-18 LAB — CBC WITH DIFFERENTIAL/PLATELET
Absolute Monocytes: 467 cells/uL (ref 200–950)
Basophils Absolute: 41 cells/uL (ref 0–200)
Basophils Relative: 0.5 %
Eosinophils Absolute: 123 cells/uL (ref 15–500)
Eosinophils Relative: 1.5 %
HCT: 38.5 % (ref 35.0–45.0)
Hemoglobin: 11.8 g/dL (ref 11.7–15.5)
Lymphs Abs: 2763 cells/uL (ref 850–3900)
MCH: 23.4 pg — ABNORMAL LOW (ref 27.0–33.0)
MCHC: 30.6 g/dL — ABNORMAL LOW (ref 32.0–36.0)
MCV: 76.2 fL — ABNORMAL LOW (ref 80.0–100.0)
MPV: 10.9 fL (ref 7.5–12.5)
Monocytes Relative: 5.7 %
Neutro Abs: 4805 cells/uL (ref 1500–7800)
Neutrophils Relative %: 58.6 %
Platelets: 255 10*3/uL (ref 140–400)
RBC: 5.05 10*6/uL (ref 3.80–5.10)
RDW: 13.5 % (ref 11.0–15.0)
Total Lymphocyte: 33.7 %
WBC: 8.2 10*3/uL (ref 3.8–10.8)

## 2020-10-18 LAB — TSH: TSH: 1.8 mIU/L

## 2020-10-18 LAB — VITAMIN D 25 HYDROXY (VIT D DEFICIENCY, FRACTURES): Vit D, 25-Hydroxy: 26 ng/mL — ABNORMAL LOW (ref 30–100)

## 2020-10-18 LAB — T4, FREE: Free T4: 1.2 ng/dL (ref 0.8–1.8)

## 2020-10-19 ENCOUNTER — Encounter: Payer: Self-pay | Admitting: Family Medicine

## 2020-10-23 MED ORDER — LEVOTHYROXINE SODIUM 88 MCG PO TABS: 88.0000 ug | ORAL_TABLET | Freq: Every day | ORAL | 3 refills | Status: DC

## 2020-10-23 NOTE — Telephone Encounter (Signed)
Lvm for pt to call the office to schedule an appt  °

## 2020-11-02 ENCOUNTER — Encounter: Payer: Self-pay | Admitting: Unknown Physician Specialty

## 2020-11-02 ENCOUNTER — Other Ambulatory Visit: Payer: Self-pay

## 2020-11-02 ENCOUNTER — Ambulatory Visit: Payer: BC Managed Care – PPO | Admitting: Unknown Physician Specialty

## 2020-11-02 DIAGNOSIS — F331 Major depressive disorder, recurrent, moderate: Secondary | ICD-10-CM

## 2020-11-02 NOTE — Progress Notes (Signed)
BP (!) 132/92   Pulse 68   Temp 98.2 F (36.8 C)   Resp 16   Ht 5' 1"  (1.549 m)   Wt 116 lb 11.2 oz (52.9 kg)   LMP 10/10/2020   SpO2 99%   BMI 22.05 kg/m    Subjective:    Patient ID: Tamara Walker, female    DOB: 06-02-1985, 35 y.o.   MRN: [id number]  HPI: Tamara Walker is a 35 y.o. female  Chief Complaint  Patient presents with   Hypothyroidism   Depression    Struggling with Buspar, Zoloft and Vistaril at night.  States it worked at one time.  Anxiety and sleep is worse.   Depression screen [facility] 2/9 11/02/2020 05/31/2020 04/07/2020 11/15/2019 10/12/2019  Decreased Interest 1 1 0 0 0  Down, Depressed, Hopeless 2 1 0 0 0  PHQ - 2 Score 3 2 0 0 0  Altered sleeping 3 3 0 0 0  Tired, decreased energy 3 3 0 0 0  Change in appetite 2 1 0 0 0  Feeling bad or failure about yourself  0 0 0 0 0  Trouble concentrating 3 1 0 0 0  Moving slowly or fidgety/restless 3 1 0 0 0  Suicidal thoughts 0 1 0 0 0  PHQ-9 Score 17 12 0 0 0  Difficult doing work/chores Very difficult Somewhat difficult Not difficult at all Not difficult at all Not difficult at all  Some recent data might be hidden     Relevant past medical, surgical, family and social history reviewed and updated as indicated. Interim medical history since our last visit reviewed. Allergies and medications reviewed and updated.  Review of Systems  Per HPI unless specifically indicated above     Objective:    BP (!) 132/92   Pulse 68   Temp 98.2 F (36.8 C)   Resp 16   Ht 5' 1"  (1.549 m)   Wt 116 lb 11.2 oz (52.9 kg)   LMP 10/10/2020   SpO2 99%   BMI 22.05 kg/m   Wt Readings from Last 3 Encounters:  11/02/20 116 lb 11.2 oz (52.9 kg)  05/31/20 124 lb 12.8 oz (56.6 kg)  11/15/19 146 lb 14.4 oz (66.6 kg)    Physical Exam Constitutional:      General: She is not in acute distress.    Appearance: Normal appearance. She is well-developed.  HENT:     Head: Normocephalic and atraumatic.  Eyes:     General: Lids  are normal. No scleral icterus.       Right eye: No discharge.        Left eye: No discharge.     Conjunctiva/sclera: Conjunctivae normal.  Cardiovascular:     Rate and Rhythm: Normal rate.  Pulmonary:     Effort: Pulmonary effort is normal.  Abdominal:     Palpations: There is no hepatomegaly or splenomegaly.  Musculoskeletal:        General: Normal range of motion.  Skin:    Coloration: Skin is not pale.     Findings: No rash.  Neurological:     Mental Status: She is alert and oriented to person, place, and time.  Psychiatric:        Behavior: Behavior normal.        Thought Content: Thought content normal.        Judgment: Judgment normal.    Results for orders placed or performed in visit on 04/07/20  CBC with Differential/Platelet  Result  Value Ref Range   WBC 6.6 3.8 - 10.8 Thousand/uL   RBC 5.37 (H) 3.80 - 5.10 Million/uL   Hemoglobin 12.1 11.7 - 15.5 g/dL   HCT 40.1 35.0 - 45.0 %   MCV 74.7 (L) 80.0 - 100.0 fL   MCH 22.5 (L) 27.0 - 33.0 pg   MCHC 30.2 (L) 32.0 - 36.0 g/dL   RDW 13.1 11.0 - 15.0 %   Platelets 260 140 - 400 Thousand/uL   MPV 10.0 7.5 - 12.5 fL   Neutro Abs 3,815 1,500 - 7,800 cells/uL   Lymphs Abs 2,277 850 - 3,900 cells/uL   Absolute Monocytes 409 200 - 950 cells/uL   Eosinophils Absolute 79 15 - 500 cells/uL   Basophils Absolute 20 0 - 200 cells/uL   Neutrophils Relative % 57.8 %   Total Lymphocyte 34.5 %   Monocytes Relative 6.2 %   Eosinophils Relative 1.2 %   Basophils Relative 0.3 %  COMPLETE METABOLIC PANEL WITH GFR  Result Value Ref Range   Glucose, Bld 92 65 - 99 mg/dL   BUN 9 7 - 25 mg/dL   Creat 0.71 0.50 - 1.10 mg/dL   GFR, Est Non African American 111 > OR = 60 mL/min/1.42m   GFR, Est African American 129 > OR = 60 mL/min/1.766m  BUN/Creatinine Ratio NOT APPLICABLE 6 - 22 (calc)   Sodium 139 135 - 146 mmol/L   Potassium 4.0 3.5 - 5.3 mmol/L   Chloride 106 98 - 110 mmol/L   CO2 28 20 - 32 mmol/L   Calcium 9.4 8.6 - 10.2  mg/dL   Total Protein 6.9 6.1 - 8.1 g/dL   Albumin 4.7 3.6 - 5.1 g/dL   Globulin 2.2 1.9 - 3.7 g/dL (calc)   AG Ratio 2.1 1.0 - 2.5 (calc)   Total Bilirubin 0.4 0.2 - 1.2 mg/dL   Alkaline phosphatase (APISO) 79 31 - 125 U/L   AST 14 10 - 30 U/L   ALT 11 6 - 29 U/L  Lipid panel  Result Value Ref Range   Cholesterol 137 <200 mg/dL   HDL 52 > OR = 50 mg/dL   Triglycerides 58 <150 mg/dL   LDL Cholesterol (Calc) 72 mg/dL (calc)   Total CHOL/HDL Ratio 2.6 <5.0 (calc)   Non-HDL Cholesterol (Calc) 85 <130 mg/dL (calc)  Hemoglobin A1c  Result Value Ref Range   Hgb A1c MFr Bld 5.5 <5.7 % of total Hgb   Mean Plasma Glucose 111 mg/dL   eAG (mmol/L) 6.2 mmol/L  TSH  Result Value Ref Range   TSH 2.51 mIU/L  Iron, TIBC and Ferritin Panel  Result Value Ref Range   Iron 51 40 - 190 mcg/dL   TIBC 306 250 - 450 mcg/dL (calc)   %SAT 17 16 - 45 % (calc)   Ferritin 46 16 - 154 ng/mL  T4, free  Result Value Ref Range   Free T4 1.2 0.8 - 1.8 ng/dL  VITAMIN D 25 Hydroxy (Vit-D Deficiency, Fractures)  Result Value Ref Range   Vit D, 25-Hydroxy 24 (L) 30 - 100 ng/mL  Iron, TIBC and Ferritin Panel  Result Value Ref Range   Iron 128 40 - 190 mcg/dL   TIBC 340 250 - 450 mcg/dL (calc)   %SAT 38 16 - 45 % (calc)   Ferritin 71 16 - 154 ng/mL  Lipid panel  Result Value Ref Range   Cholesterol 165 <200 mg/dL   HDL 75 > OR = 50 mg/dL  Triglycerides 55 <150 mg/dL   LDL Cholesterol (Calc) 76 mg/dL (calc)   Total CHOL/HDL Ratio 2.2 <5.0 (calc)   Non-HDL Cholesterol (Calc) 90 <130 mg/dL (calc)  COMPLETE METABOLIC PANEL WITH GFR  Result Value Ref Range   Glucose, Bld 126 (H) 65 - 99 mg/dL   BUN 7 7 - 25 mg/dL   Creat 0.82 0.50 - 0.97 mg/dL   eGFR 96 > OR = 60 mL/min/1.35m   BUN/Creatinine Ratio NOT APPLICABLE 6 - 22 (calc)   Sodium 138 135 - 146 mmol/L   Potassium 3.8 3.5 - 5.3 mmol/L   Chloride 104 98 - 110 mmol/L   CO2 28 20 - 32 mmol/L   Calcium 9.3 8.6 - 10.2 mg/dL   Total Protein 7.2 6.1  - 8.1 g/dL   Albumin 4.8 3.6 - 5.1 g/dL   Globulin 2.4 1.9 - 3.7 g/dL (calc)   AG Ratio 2.0 1.0 - 2.5 (calc)   Total Bilirubin 0.7 0.2 - 1.2 mg/dL   Alkaline phosphatase (APISO) 88 31 - 125 U/L   AST 18 10 - 30 U/L   ALT 13 6 - 29 U/L  Hemoglobin A1c  Result Value Ref Range   Hgb A1c MFr Bld 5.4 <5.7 % of total Hgb   Mean Plasma Glucose 108 mg/dL   eAG (mmol/L) 6.0 mmol/L  VITAMIN D 25 Hydroxy (Vit-D Deficiency, Fractures)  Result Value Ref Range   Vit D, 25-Hydroxy 26 (L) 30 - 100 ng/mL  TSH  Result Value Ref Range   TSH 1.80 mIU/L  T4, free  Result Value Ref Range   Free T4 1.2 0.8 - 1.8 ng/dL  CBC with Differential/Platelet  Result Value Ref Range   WBC 8.2 3.8 - 10.8 Thousand/uL   RBC 5.05 3.80 - 5.10 Million/uL   Hemoglobin 11.8 11.7 - 15.5 g/dL   HCT 38.5 35.0 - 45.0 %   MCV 76.2 (L) 80.0 - 100.0 fL   MCH 23.4 (L) 27.0 - 33.0 pg   MCHC 30.6 (L) 32.0 - 36.0 g/dL   RDW 13.5 11.0 - 15.0 %   Platelets 255 140 - 400 Thousand/uL   MPV 10.9 7.5 - 12.5 fL   Neutro Abs 4,805 1,500 - 7,800 cells/uL   Lymphs Abs 2,763 850 - 3,900 cells/uL   Absolute Monocytes 467 200 - 950 cells/uL   Eosinophils Absolute 123 15 - 500 cells/uL   Basophils Absolute 41 0 - 200 cells/uL   Neutrophils Relative % 58.6 %   Total Lymphocyte 33.7 %   Monocytes Relative 5.7 %   Eosinophils Relative 1.5 %   Basophils Relative 0.5 %      Assessment & Plan:   Problem List Items Addressed This Visit       Unprioritized   Moderate episode of recurrent major depressive disorder (HCC)    Worsening along with insomnia.  Written recommendations as follows: Start Zoloft in the morning Magnesium 1 hour before bed - Citrate  [place] - try once a day - try around dinner time L-theanine  To reset circadian rhythm As soon as you wake up go outside 10-15 minutes As sun is setting go outside again Spend as much time as possible outdoors.   Blue Light blockers once dark.          Follow up  plan: Return in about 4 weeks (around 11/30/2020).

## 2020-11-02 NOTE — Patient Instructions (Signed)
Start Zoloft in the morning Magnesium 1 hour before bed - Citrate  [place] - try once a day - try around dinner time L-theanine  To reset circadian rhythm As soon as you wake up go outside 10-15 minutes As sun is setting go outside again Spend as much time as possible outdoors.   Blue Light blockers once dark.

## 2020-11-02 NOTE — Assessment & Plan Note (Signed)
Worsening along with insomnia.  Written recommendations as follows: Start Zoloft in the morning Magnesium 1 hour before bed - Citrate  [place]anda - try once a day - try around dinner time L-theanine  To reset circadian rhythm As soon as you wake up go outside 10-15 minutes As sun is setting go outside again Spend as much time as possible outdoors.   Blue Light blockers once dark.

## 2021-03-16 ENCOUNTER — Ambulatory Visit: Payer: BC Managed Care – PPO | Admitting: Nurse Practitioner

## 2021-03-16 ENCOUNTER — Encounter: Payer: Self-pay | Admitting: Nurse Practitioner

## 2021-03-16 ENCOUNTER — Ambulatory Visit
Admission: RE | Admit: 2021-03-16 | Discharge: 2021-03-16 | Disposition: A | Discharge status: Home / Self Care | Payer: BC Managed Care – PPO | Source: Ambulatory Visit | Attending: Nurse Practitioner | Admitting: Nurse Practitioner

## 2021-03-16 ENCOUNTER — Other Ambulatory Visit: Payer: Self-pay

## 2021-03-16 ENCOUNTER — Ambulatory Visit
Admission: RE | Admit: 2021-03-16 | Discharge: 2021-03-16 | Disposition: A | Discharge status: Home / Self Care | Payer: BC Managed Care – PPO | Attending: Pediatrics | Admitting: Pediatrics

## 2021-03-16 VITALS — BP 110/62 | HR 100 | Temp 98.2°F | Resp 18 | Ht 61.0 in | Wt 108.7 lb

## 2021-03-16 DIAGNOSIS — E782 Mixed hyperlipidemia: Secondary | ICD-10-CM

## 2021-03-16 DIAGNOSIS — F17210 Nicotine dependence, cigarettes, uncomplicated: Secondary | ICD-10-CM

## 2021-03-16 DIAGNOSIS — R634 Abnormal weight loss: Secondary | ICD-10-CM

## 2021-03-16 DIAGNOSIS — R002 Palpitations: Secondary | ICD-10-CM | POA: Insufficient documentation

## 2021-03-16 DIAGNOSIS — F419 Anxiety disorder, unspecified: Secondary | ICD-10-CM

## 2021-03-16 DIAGNOSIS — R0602 Shortness of breath: Secondary | ICD-10-CM | POA: Diagnosis present

## 2021-03-16 DIAGNOSIS — F331 Major depressive disorder, recurrent, moderate: Secondary | ICD-10-CM

## 2021-03-16 DIAGNOSIS — R630 Anorexia: Secondary | ICD-10-CM

## 2021-03-16 DIAGNOSIS — N6325 Unspecified lump in the left breast, overlapping quadrants: Secondary | ICD-10-CM

## 2021-03-16 DIAGNOSIS — E039 Hypothyroidism, unspecified: Secondary | ICD-10-CM

## 2021-03-16 NOTE — Progress Notes (Signed)
BP 110/62    Pulse 100    Temp 98.2 F (36.8 C) (Oral)    Resp 18    Ht 5' 1"  (3.403 m)    Wt 108 lb 11.2 oz (49.3 kg)    SpO2 99%    BMI 20.54 kg/m    Subjective:    Patient ID: Tamara Walker, female    DOB: 02-11-1986, 36 y.o.   MRN: [id number]  HPI: Tamara Walker is a 36 y.o. female  Chief Complaint  Patient presents with   Hypothyroidism        Weight Loss   Hypothyroidism: She says she is currently taking 88 mcg daily.  She says she has had some weight loss, palpitations, and constipation.  Her last TSH was 10/17/20 and it was 1.80. Will get labs, and ekg.  Weight loss/ loss of appetite: she says a few months ago she lost her appetite and has been losing weight. She says she was 148 lbs.  However, at her last visit on 11/02/20 she was 116 lbs. Today she is 108 lbs. She says she just does not have an appetite. Discussed increasing caloric intake.  Will get labs, and chest xray since she is a smoker.   Palpitations:  She says she has palpitations all the time for a few months.  She denies any chest pain, dizziness or syncope.  She says she just feels her heart beating all the time.  In office EKG was NSR. Getting labs.   Hyperlipidemia: She is taking rosuvastatin 5 mg daily.  She denies any myalgia. Last LDL was 76 on 10/17/20. Will get labs today.  Nicotine dependence/ shortness of breath:  She says she has been reducing the amount of cigarettes she smokes and is down to 4 a day.  She says that she has periods where she feels short of breath.  She says it does not matter what she is doing she could be just sitting there and she feels like she can not take a deep breath.  Will get chest xray.   Breast lumps:  She says she noticed a lump in both her breasts. They are both located in different areas of the breast. She says they have never been there before.  There is a lump at 12 o'clock on the left breast just above the nipple about the size of a quarter.  There is also a lump at about 9  o' clock on the right breast close to the axilla that is about the size of a penny. She denies any changes in skin and denies any nipple discharge. Will get diagnostic mammogram and ultrasound.   Anxiety/Depression: She says that she has had anxiety and depression for most of her life.  She was taking Zoloft, buspar, and vistaril.  She says she stopped taking it and is not interested in taking medication or counseling. She denies any suicidal thoughts.    Depression screen [facility] 2/9 03/16/2021 11/02/2020 05/31/2020 04/07/2020 11/15/2019  Decreased Interest 2 1 1  0 0  Down, Depressed, Hopeless 2 2 1  0 0  PHQ - 2 Score 4 3 2  0 0  Altered sleeping 3 3 3  0 0  Tired, decreased energy 3 3 3  0 0  Change in appetite 3 2 1  0 0  Feeling bad or failure about yourself  2 0 0 0 0  Trouble concentrating 1 3 1  0 0  Moving slowly or fidgety/restless 1 3 1  0 0  Suicidal thoughts 1 0 1  0 0  PHQ-9 Score 18 17 12  0 0  Difficult doing work/chores - Very difficult Somewhat difficult Not difficult at all Not difficult at all  Some recent data might be hidden    GAD 7 : Generalized Anxiety Score 03/16/2021 05/31/2020 04/07/2020 08/10/2019  Nervous, Anxious, on Edge 3 3 1 3   Control/stop worrying 2 2 0 2  Worry too much - different things 2 3 0 3  Trouble relaxing 3 3 1 3   Restless 3 3 0 2  Easily annoyed or irritable 3 3 1 2   Afraid - awful might happen 1 0 0 0  Total GAD 7 Score 17 17 3 15   Anxiety Difficulty Somewhat difficult Somewhat difficult Not difficult at all Somewhat difficult     Relevant past medical, surgical, family and social history reviewed and updated as indicated. Interim medical history since our last visit reviewed. Allergies and medications reviewed and updated.  Review of Systems  Constitutional: Negative for fever, positive for weight change (loss) Respiratory: Negative for cough, occasional shortness of breath.   Cardiovascular: Negative for chest pain, positive for palpitations.   Gastrointestinal: Negative for abdominal pain, no bowel changes.  Musculoskeletal: Negative for gait problem or joint swelling.  Skin: Negative for rash.  Neurological: Negative for dizziness or headache.  No other specific complaints in a complete review of systems (except as listed in HPI above).      Objective:    BP 110/62    Pulse 100    Temp 98.2 F (36.8 C) (Oral)    Resp 18    Ht 5' 1"  (1.549 m)    Wt 108 lb 11.2 oz (49.3 kg)    SpO2 99%    BMI 20.54 kg/m   Wt Readings from Last 3 Encounters:  03/16/21 108 lb 11.2 oz (49.3 kg)  11/02/20 116 lb 11.2 oz (52.9 kg)  05/31/20 124 lb 12.8 oz (56.6 kg)    Physical Exam  Constitutional: Patient appears well-developed and well-nourished. No distress.  HEENT: head atraumatic, normocephalic, pupils equal and reactive to light, neck supple Cardiovascular: Normal rate, regular rhythm and normal heart sounds.  No murmur heard. No BLE edema. Pulmonary/Chest: Effort normal and breath sounds normal. No respiratory distress. Abdominal: Soft.  There is no tenderness. Psychiatric: Patient has a normal mood and affect. behavior is normal. Judgment and thought content normal.  Breasts: right breast mass noted at 9 o'clock close to the axilla, skin or nipple changes or axillary nodes, left breast mass noted at 12 o'clock just above the nipple, skin or nipple changes or axillary nodes.   Results for orders placed or performed in visit on 04/07/20  CBC with Differential/Platelet  Result Value Ref Range   WBC 6.6 3.8 - 10.8 Thousand/uL   RBC 5.37 (H) 3.80 - 5.10 Million/uL   Hemoglobin 12.1 11.7 - 15.5 g/dL   HCT 40.1 35.0 - 45.0 %   MCV 74.7 (L) 80.0 - 100.0 fL   MCH 22.5 (L) 27.0 - 33.0 pg   MCHC 30.2 (L) 32.0 - 36.0 g/dL   RDW 13.1 11.0 - 15.0 %   Platelets 260 140 - 400 Thousand/uL   MPV 10.0 7.5 - 12.5 fL   Neutro Abs 3,815 1,500 - 7,800 cells/uL   Lymphs Abs 2,277 850 - 3,900 cells/uL   Absolute Monocytes 409 200 - 950 cells/uL    Eosinophils Absolute 79 15 - 500 cells/uL   Basophils Absolute 20 0 - 200 cells/uL   Neutrophils Relative %  57.8 %   Total Lymphocyte 34.5 %   Monocytes Relative 6.2 %   Eosinophils Relative 1.2 %   Basophils Relative 0.3 %  COMPLETE METABOLIC PANEL WITH GFR  Result Value Ref Range   Glucose, Bld 92 65 - 99 mg/dL   BUN 9 7 - 25 mg/dL   Creat 0.71 0.50 - 1.10 mg/dL   GFR, Est Non African American 111 > OR = 60 mL/min/1.75m   GFR, Est African American 129 > OR = 60 mL/min/1.791m  BUN/Creatinine Ratio NOT APPLICABLE 6 - 22 (calc)   Sodium 139 135 - 146 mmol/L   Potassium 4.0 3.5 - 5.3 mmol/L   Chloride 106 98 - 110 mmol/L   CO2 28 20 - 32 mmol/L   Calcium 9.4 8.6 - 10.2 mg/dL   Total Protein 6.9 6.1 - 8.1 g/dL   Albumin 4.7 3.6 - 5.1 g/dL   Globulin 2.2 1.9 - 3.7 g/dL (calc)   AG Ratio 2.1 1.0 - 2.5 (calc)   Total Bilirubin 0.4 0.2 - 1.2 mg/dL   Alkaline phosphatase (APISO) 79 31 - 125 U/L   AST 14 10 - 30 U/L   ALT 11 6 - 29 U/L  Lipid panel  Result Value Ref Range   Cholesterol 137 <200 mg/dL   HDL 52 > OR = 50 mg/dL   Triglycerides 58 <150 mg/dL   LDL Cholesterol (Calc) 72 mg/dL (calc)   Total CHOL/HDL Ratio 2.6 <5.0 (calc)   Non-HDL Cholesterol (Calc) 85 <130 mg/dL (calc)  Hemoglobin A1c  Result Value Ref Range   Hgb A1c MFr Bld 5.5 <5.7 % of total Hgb   Mean Plasma Glucose 111 mg/dL   eAG (mmol/L) 6.2 mmol/L  TSH  Result Value Ref Range   TSH 2.51 mIU/L  Iron, TIBC and Ferritin Panel  Result Value Ref Range   Iron 51 40 - 190 mcg/dL   TIBC 306 250 - 450 mcg/dL (calc)   %SAT 17 16 - 45 % (calc)   Ferritin 46 16 - 154 ng/mL  T4, free  Result Value Ref Range   Free T4 1.2 0.8 - 1.8 ng/dL  VITAMIN D 25 Hydroxy (Vit-D Deficiency, Fractures)  Result Value Ref Range   Vit D, 25-Hydroxy 24 (L) 30 - 100 ng/mL  Iron, TIBC and Ferritin Panel  Result Value Ref Range   Iron 128 40 - 190 mcg/dL   TIBC 340 250 - 450 mcg/dL (calc)   %SAT 38 16 - 45 % (calc)   Ferritin  71 16 - 154 ng/mL  Lipid panel  Result Value Ref Range   Cholesterol 165 <200 mg/dL   HDL 75 > OR = 50 mg/dL   Triglycerides 55 <150 mg/dL   LDL Cholesterol (Calc) 76 mg/dL (calc)   Total CHOL/HDL Ratio 2.2 <5.0 (calc)   Non-HDL Cholesterol (Calc) 90 <130 mg/dL (calc)  COMPLETE METABOLIC PANEL WITH GFR  Result Value Ref Range   Glucose, Bld 126 (H) 65 - 99 mg/dL   BUN 7 7 - 25 mg/dL   Creat 0.82 0.50 - 0.97 mg/dL   eGFR 96 > OR = 60 mL/min/1.7346m BUN/Creatinine Ratio NOT APPLICABLE 6 - 22 (calc)   Sodium 138 135 - 146 mmol/L   Potassium 3.8 3.5 - 5.3 mmol/L   Chloride 104 98 - 110 mmol/L   CO2 28 20 - 32 mmol/L   Calcium 9.3 8.6 - 10.2 mg/dL   Total Protein 7.2 6.1 - 8.1 g/dL   Albumin  4.8 3.6 - 5.1 g/dL   Globulin 2.4 1.9 - 3.7 g/dL (calc)   AG Ratio 2.0 1.0 - 2.5 (calc)   Total Bilirubin 0.7 0.2 - 1.2 mg/dL   Alkaline phosphatase (APISO) 88 31 - 125 U/L   AST 18 10 - 30 U/L   ALT 13 6 - 29 U/L  Hemoglobin A1c  Result Value Ref Range   Hgb A1c MFr Bld 5.4 <5.7 % of total Hgb   Mean Plasma Glucose 108 mg/dL   eAG (mmol/L) 6.0 mmol/L  VITAMIN D 25 Hydroxy (Vit-D Deficiency, Fractures)  Result Value Ref Range   Vit D, 25-Hydroxy 26 (L) 30 - 100 ng/mL  TSH  Result Value Ref Range   TSH 1.80 mIU/L  T4, free  Result Value Ref Range   Free T4 1.2 0.8 - 1.8 ng/dL  CBC with Differential/Platelet  Result Value Ref Range   WBC 8.2 3.8 - 10.8 Thousand/uL   RBC 5.05 3.80 - 5.10 Million/uL   Hemoglobin 11.8 11.7 - 15.5 g/dL   HCT 38.5 35.0 - 45.0 %   MCV 76.2 (L) 80.0 - 100.0 fL   MCH 23.4 (L) 27.0 - 33.0 pg   MCHC 30.6 (L) 32.0 - 36.0 g/dL   RDW 13.5 11.0 - 15.0 %   Platelets 255 140 - 400 Thousand/uL   MPV 10.9 7.5 - 12.5 fL   Neutro Abs 4,805 1,500 - 7,800 cells/uL   Lymphs Abs 2,763 850 - 3,900 cells/uL   Absolute Monocytes 467 200 - 950 cells/uL   Eosinophils Absolute 123 15 - 500 cells/uL   Basophils Absolute 41 0 - 200 cells/uL   Neutrophils Relative % 58.6 %    Total Lymphocyte 33.7 %   Monocytes Relative 5.7 %   Eosinophils Relative 1.5 %   Basophils Relative 0.5 %      Assessment & Plan:   1. Hypothyroidism, unspecified type  - Thyroid Panel With TSH  2. Weight loss  - CBC with Differential/Platelet - COMPLETE METABOLIC PANEL WITH GFR - Thyroid Panel With TSH - DG Chest 2 View; Future  3. Decrease in appetite -increase caloric intake  4. Palpitations  - DG Chest 2 View; Future - EKG 12-Lead  5. Mixed hyperlipidemia  - Lipid panel  6. Cigarette nicotine dependence without complication  - DG Chest 2 View; Future  7. Shortness of breath  - DG Chest 2 View; Future  8. Breast lump on left side at 12 o'clock position  - MM DIAG BREAST TOMO BILATERAL; Future - US BREAST LTD UNI LEFT INC AXILLA; Future  9. Breast lump on left side at 9 o'clock position  - MM DIAG BREAST TOMO BILATERAL; Future - US BREAST LTD UNI RIGHT INC AXILLA; Future  10. Moderate episode of recurrent major depressive disorder ([place]) -let me know if you would like to start treatment  11. Anxiety -let me know if you would like to start treatment  Follow up plan: Return in about 4 weeks (around 04/13/2021) for follow up.

## 2021-03-17 LAB — CBC WITH DIFFERENTIAL/PLATELET
Absolute Monocytes: 316 cells/uL (ref 200–950)
Basophils Absolute: 32 cells/uL (ref 0–200)
Basophils Relative: 0.4 %
Eosinophils Absolute: 32 cells/uL (ref 15–500)
Eosinophils Relative: 0.4 %
HCT: 36.9 % (ref 35.0–45.0)
Hemoglobin: 11.4 g/dL — ABNORMAL LOW (ref 11.7–15.5)
Lymphs Abs: 2543 cells/uL (ref 850–3900)
MCH: 23.6 pg — ABNORMAL LOW (ref 27.0–33.0)
MCHC: 30.9 g/dL — ABNORMAL LOW (ref 32.0–36.0)
MCV: 76.2 fL — ABNORMAL LOW (ref 80.0–100.0)
MPV: 10.1 fL (ref 7.5–12.5)
Monocytes Relative: 3.9 %
Neutro Abs: 5176 cells/uL (ref 1500–7800)
Neutrophils Relative %: 63.9 %
Platelets: 258 10*3/uL (ref 140–400)
RBC: 4.84 10*6/uL (ref 3.80–5.10)
RDW: 13.4 % (ref 11.0–15.0)
Total Lymphocyte: 31.4 %
WBC: 8.1 10*3/uL (ref 3.8–10.8)

## 2021-03-17 LAB — COMPLETE METABOLIC PANEL WITH GFR
AG Ratio: 2.1 (calc) (ref 1.0–2.5)
ALT: 9 U/L (ref 6–29)
AST: 14 U/L (ref 10–30)
Albumin: 4.6 g/dL (ref 3.6–5.1)
Alkaline phosphatase (APISO): 66 U/L (ref 31–125)
BUN/Creatinine Ratio: 10 (calc) (ref 6–22)
BUN: 6 mg/dL — ABNORMAL LOW (ref 7–25)
CO2: 28 mmol/L (ref 20–32)
Calcium: 9.5 mg/dL (ref 8.6–10.2)
Chloride: 104 mmol/L (ref 98–110)
Creat: 0.62 mg/dL (ref 0.50–0.97)
Globulin: 2.2 g/dL (calc) (ref 1.9–3.7)
Glucose, Bld: 129 mg/dL — ABNORMAL HIGH (ref 65–99)
Potassium: 3.8 mmol/L (ref 3.5–5.3)
Sodium: 138 mmol/L (ref 135–146)
Total Bilirubin: 0.4 mg/dL (ref 0.2–1.2)
Total Protein: 6.8 g/dL (ref 6.1–8.1)
eGFR: 119 mL/min/{1.73_m2} (ref >=60)

## 2021-03-17 LAB — LIPID PANEL
Cholesterol: 153 mg/dL (ref <200)
HDL: 62 mg/dL (ref >=50)
LDL Cholesterol (Calc): 76 mg/dL (calc)
Non-HDL Cholesterol (Calc): 91 mg/dL (calc) (ref <130)
Total CHOL/HDL Ratio: 2.5 (calc) (ref <5.0)
Triglycerides: 73 mg/dL (ref <150)

## 2021-03-17 LAB — THYROID PANEL WITH TSH
Free Thyroxine Index: 2.8 (ref 1.4–3.8)
T3 Uptake: 30 % (ref 22–35)
T4, Total: 9.4 ug/dL (ref 5.1–11.9)
TSH: 0.31 mIU/L — ABNORMAL LOW

## 2021-03-19 ENCOUNTER — Other Ambulatory Visit: Payer: Self-pay | Admitting: Nurse Practitioner

## 2021-03-19 DIAGNOSIS — E039 Hypothyroidism, unspecified: Secondary | ICD-10-CM

## 2021-03-19 MED ORDER — LEVOTHYROXINE SODIUM 75 MCG PO TABS: 75.0000 ug | ORAL_TABLET | Freq: Every day | ORAL | 0 refills | Status: DC

## 2021-04-02 ENCOUNTER — Other Ambulatory Visit: Payer: Self-pay

## 2021-04-02 ENCOUNTER — Ambulatory Visit
Admission: RE | Admit: 2021-04-02 | Discharge: 2021-04-02 | Disposition: A | Discharge status: Home / Self Care | Payer: BC Managed Care – PPO | Source: Ambulatory Visit | Attending: Nurse Practitioner | Admitting: Nurse Practitioner

## 2021-04-02 DIAGNOSIS — N6325 Unspecified lump in the left breast, overlapping quadrants: Secondary | ICD-10-CM

## 2021-04-19 ENCOUNTER — Other Ambulatory Visit: Payer: Self-pay | Admitting: Emergency Medicine

## 2021-04-19 DIAGNOSIS — E039 Hypothyroidism, unspecified: Secondary | ICD-10-CM

## 2021-04-20 ENCOUNTER — Other Ambulatory Visit: Payer: Self-pay | Admitting: Emergency Medicine

## 2021-04-20 DIAGNOSIS — E039 Hypothyroidism, unspecified: Secondary | ICD-10-CM

## 2021-04-20 LAB — TSH: TSH: 1.88 mIU/L

## 2021-04-20 MED ORDER — LEVOTHYROXINE SODIUM 75 MCG PO TABS: 75.0000 ug | ORAL_TABLET | Freq: Every day | ORAL | 2 refills | Status: DC

## 2021-06-28 ENCOUNTER — Other Ambulatory Visit: Payer: Self-pay | Admitting: Family Medicine

## 2021-06-28 DIAGNOSIS — E782 Mixed hyperlipidemia: Secondary | ICD-10-CM

## 2021-09-26 ENCOUNTER — Other Ambulatory Visit: Payer: Self-pay | Admitting: Family Medicine

## 2021-09-26 DIAGNOSIS — E782 Mixed hyperlipidemia: Secondary | ICD-10-CM

## 2021-09-27 MED ORDER — ROSUVASTATIN CALCIUM 5 MG PO TABS: 5.0000 mg | ORAL_TABLET | Freq: Every day | ORAL | 0 refills | Status: DC

## 2023-03-17 IMAGING — US US BREAST*L* LIMITED INC AXILLA
1 series · 8 of 8 positions shown · non-contrast
Comparison: None.

CLINICAL DATA: 35-year-old female presenting for evaluation of
bilateral palpable lumps. This is her baseline exam.



[Series 1: us breast*left* limited inc axilla · 0.06mm/px · 8 of 8 slices shown]
[im 1/8]
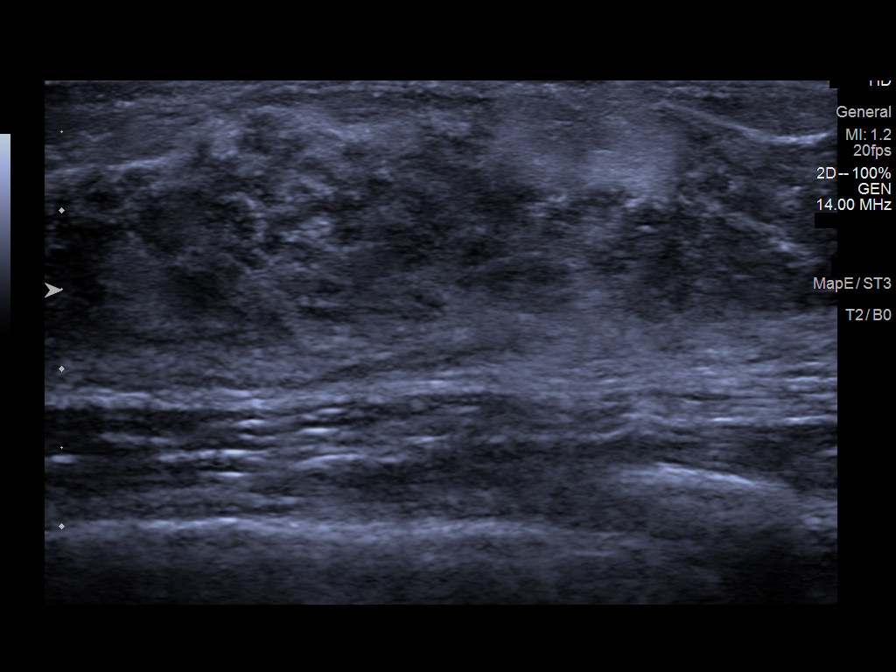
[im 2/8]
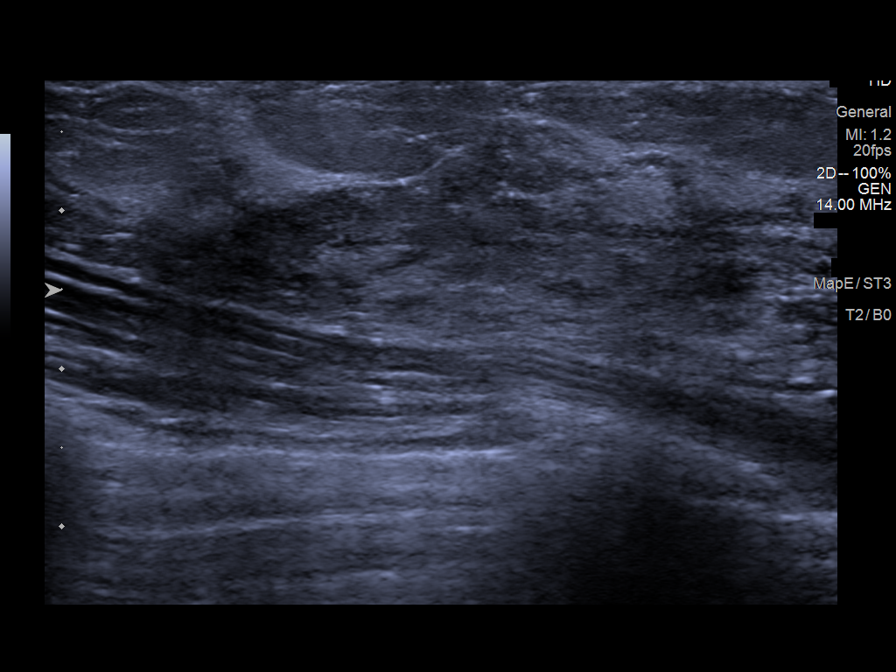
[im 3/8]
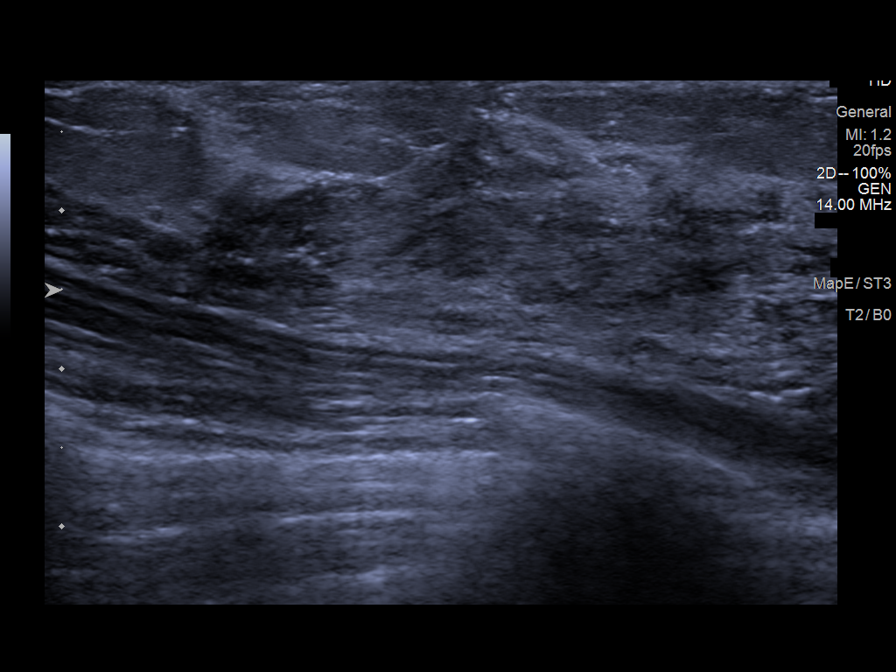
[im 4/8]
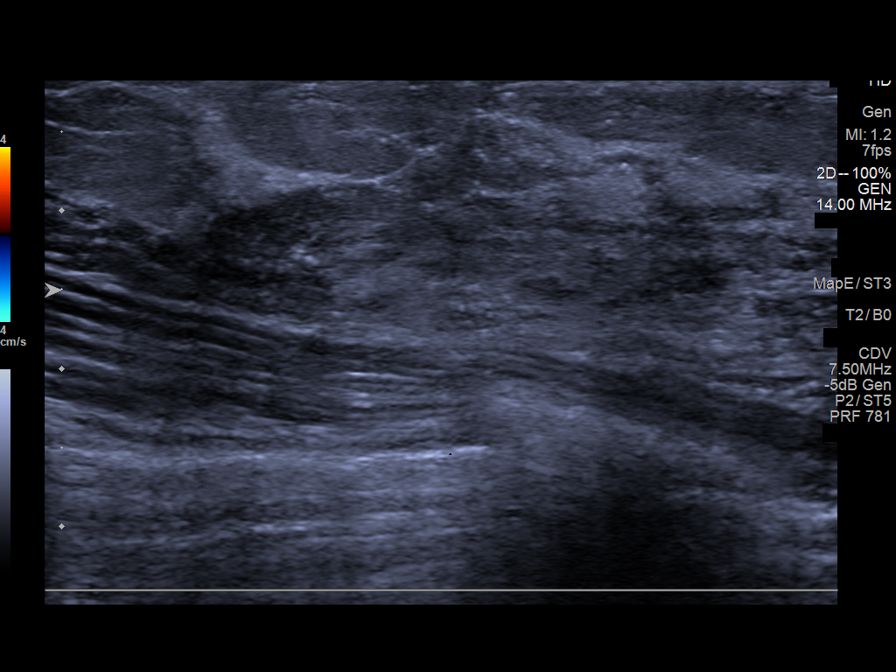
[im 5/8]
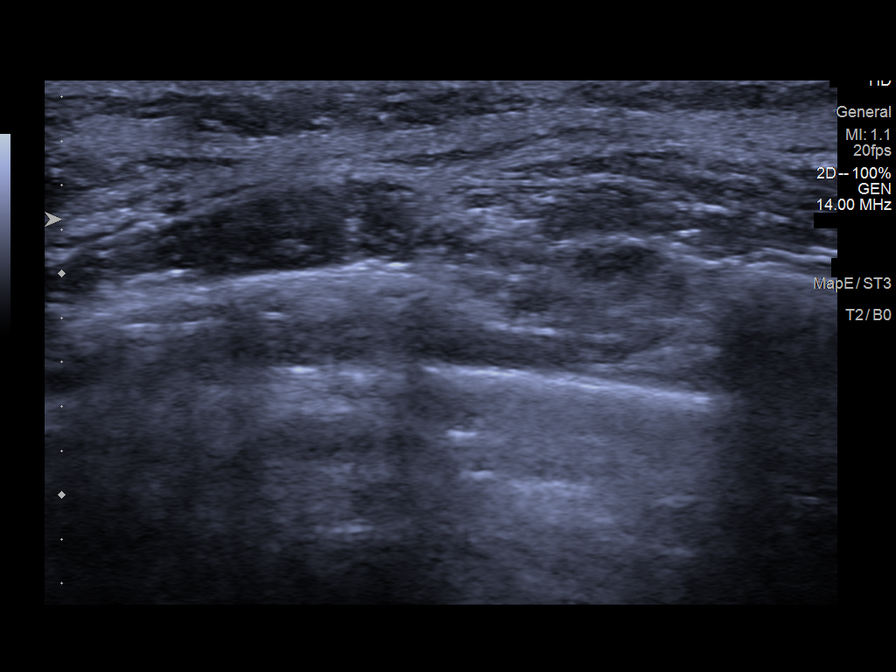
[im 6/8]
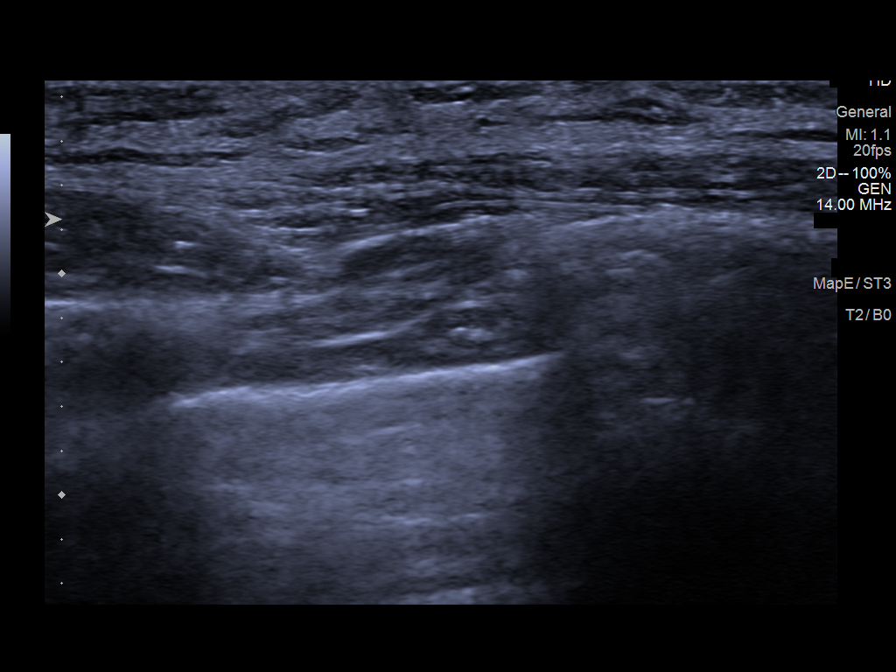
[im 7/8]
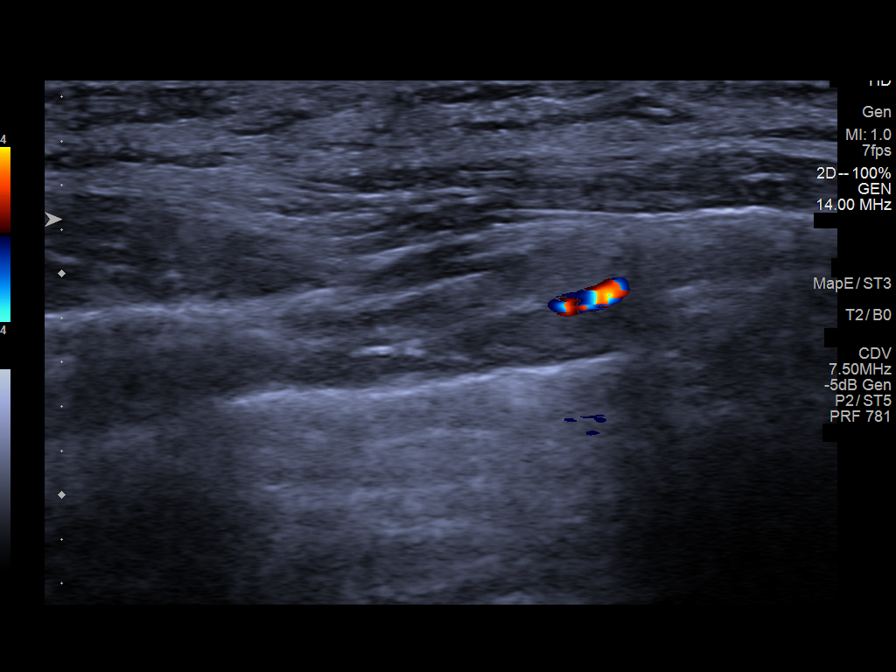
[im 8/8]
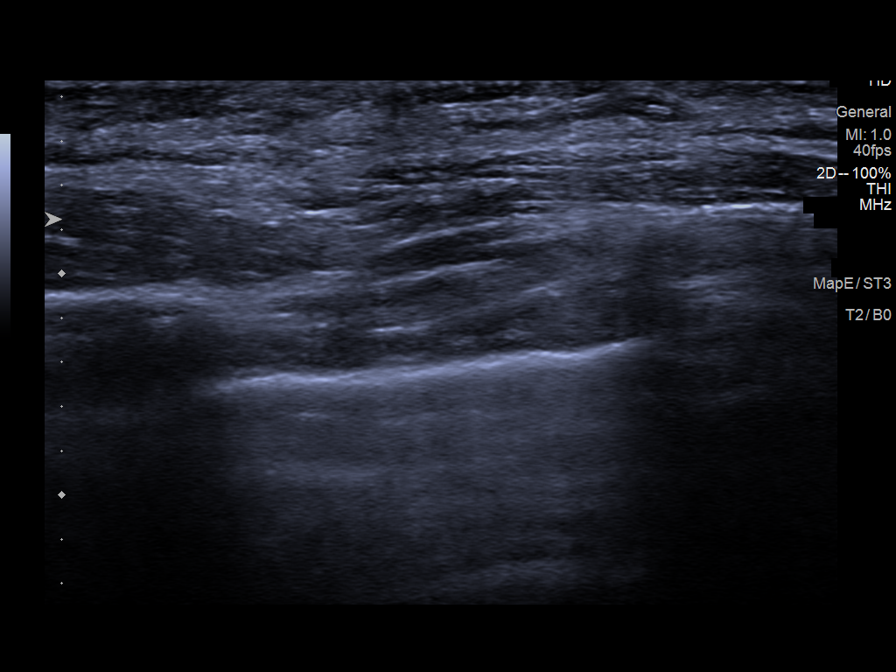

[8 of 8 positions shown; findings below may reference images not displayed]

ACR Breast Density Category c: The breast tissue is heterogeneously
dense, which may obscure small masses.
FINDINGS: No suspicious calcifications, masses or areas of distortion are seen
in the bilateral breasts. Mild vascular calcifications are noted in
both breasts.

Ultrasound targeted to the lateral aspect of the right breast
demonstrates normal dense fibroglandular tissue. No suspicious
masses or areas of shadowing are identified.

Ultrasound targeted to the superior and the lateral aspect of the
left breast demonstrates normal fibroglandular tissue. No suspicious
masses or areas of shadowing are identified.
IMPRESSION: 1. There are no suspicious mammographic or targeted sonographic
abnormalities at the palpable sites of concern in both breasts.

2. There are mild bilateral vascular calcifications in both breasts,
which is unusual in a patient of this age.

3.  No mammographic evidence of malignancy in the bilateral breasts.

RECOMMENDATION:
1. Clinical follow-up recommended for the palpable and tender areas
of concern in the bilateral breasts. Any further workup should be
based on clinical grounds.

2. Consider cardiovascular risk assessment due to early vascular
calcifications seen on mammography.

3. Screening mammogram at age 40 unless there are persistent or
intervening clinical concerns. (Code:NT-E-CJD)

I have discussed the findings and recommendations with the patient.
If applicable, a reminder letter will be sent to the patient
regarding the next appointment.

BI-RADS CATEGORY  1: Negative.

## 2023-03-17 IMAGING — MG DIGITAL DIAGNOSTIC BILAT W/ TOMO W/ CAD
6 of 10 series · 6 of 30 positions shown · non-contrast
Comparison: None.

CLINICAL DATA: 35-year-old female presenting for evaluation of
bilateral palpable lumps. This is her baseline exam.



[L MLO synth-2D]
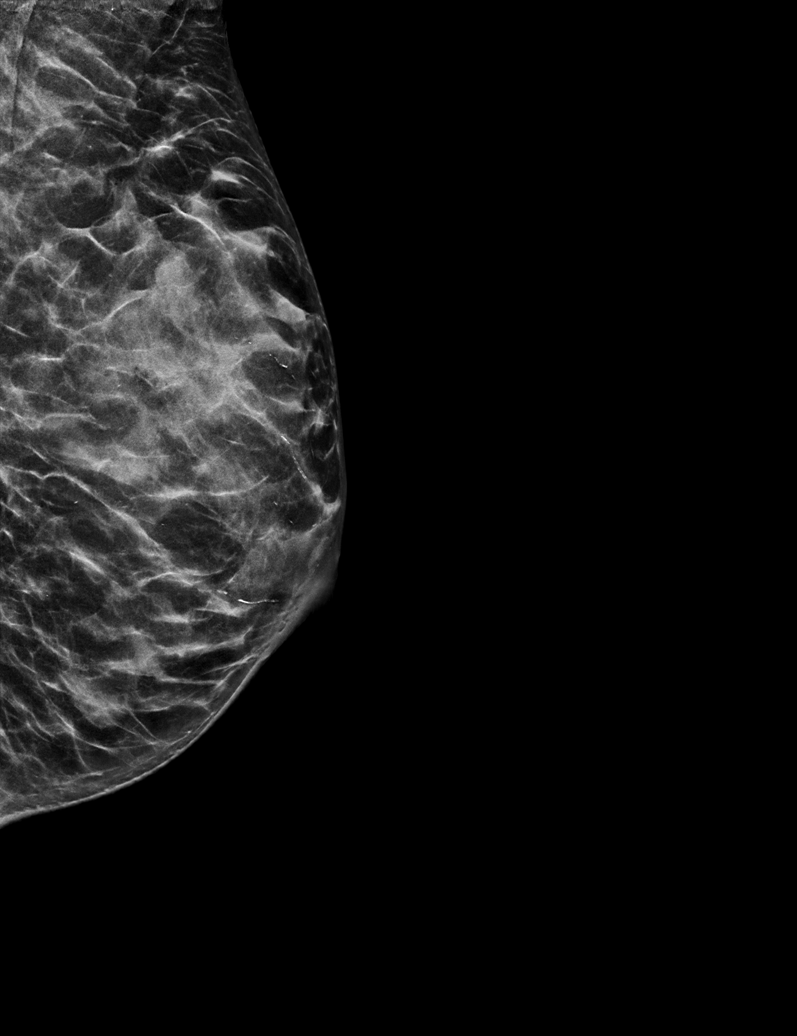

[R CC synth-2D]
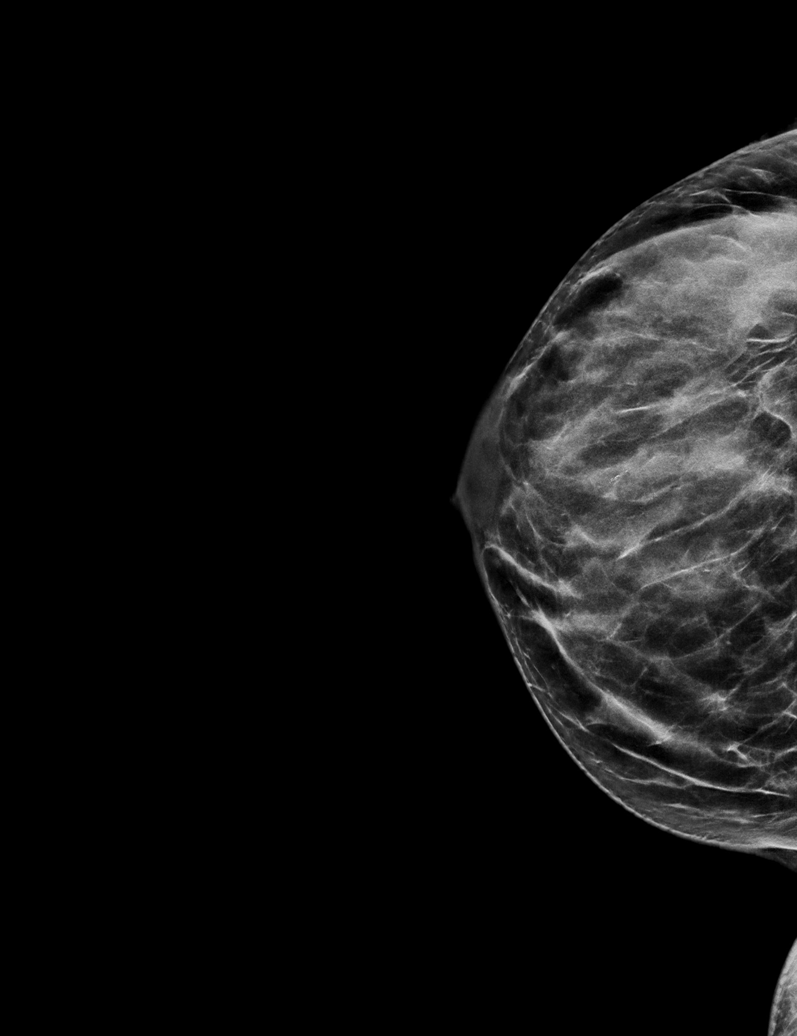

[R MLO synth-2D]
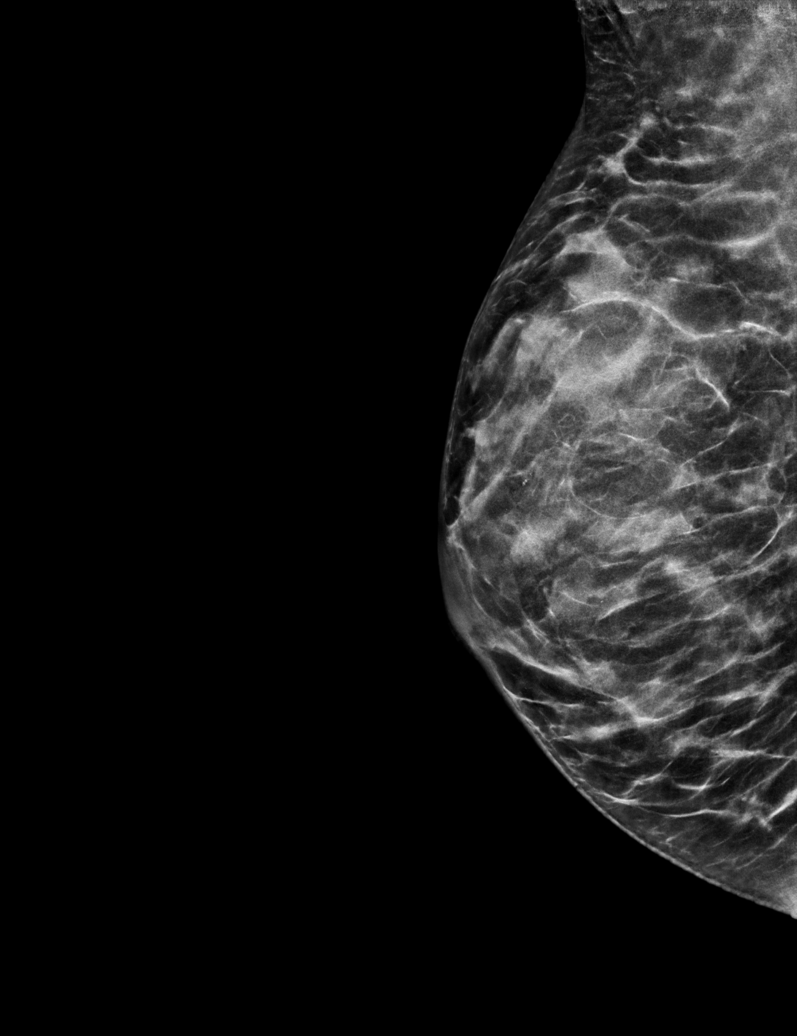

[L CC synth-2D]
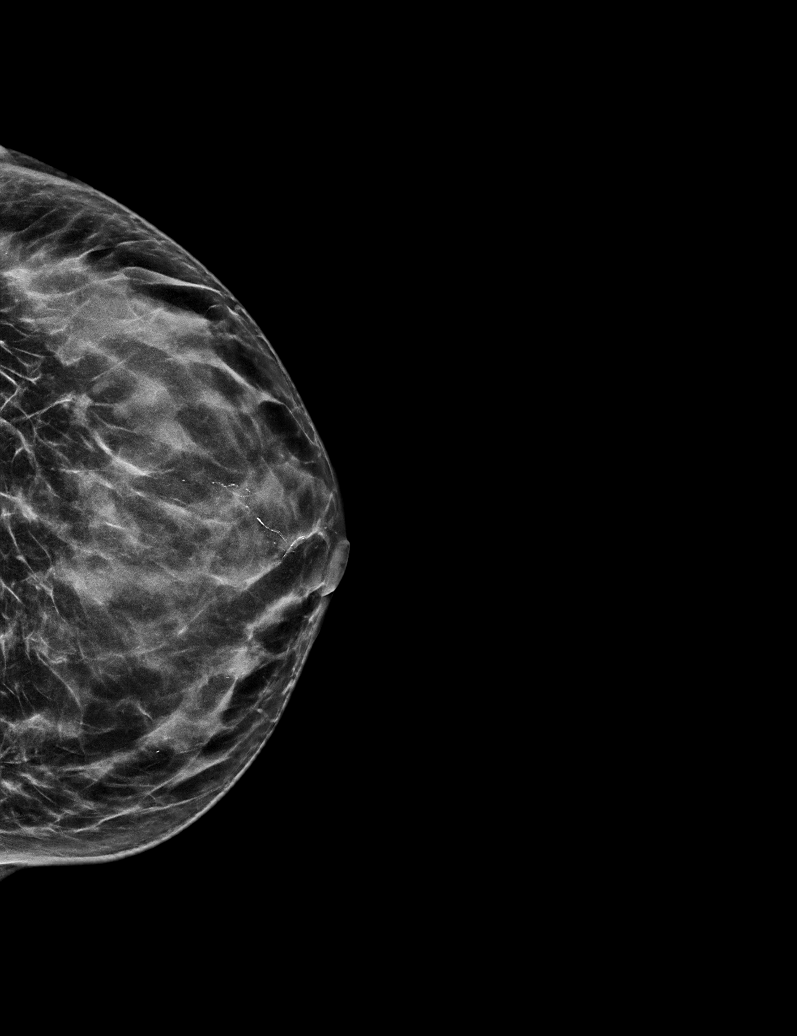

[L TAN synth-2D]
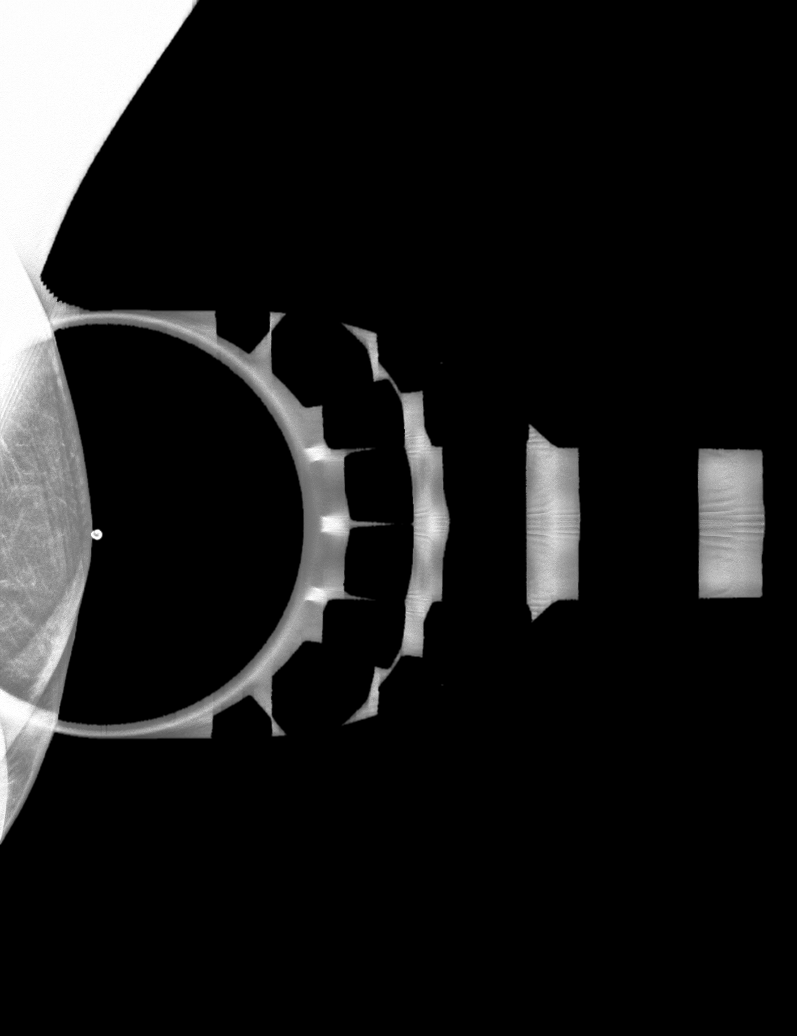

[L MLO tomo · tomo slice 25/50.0]
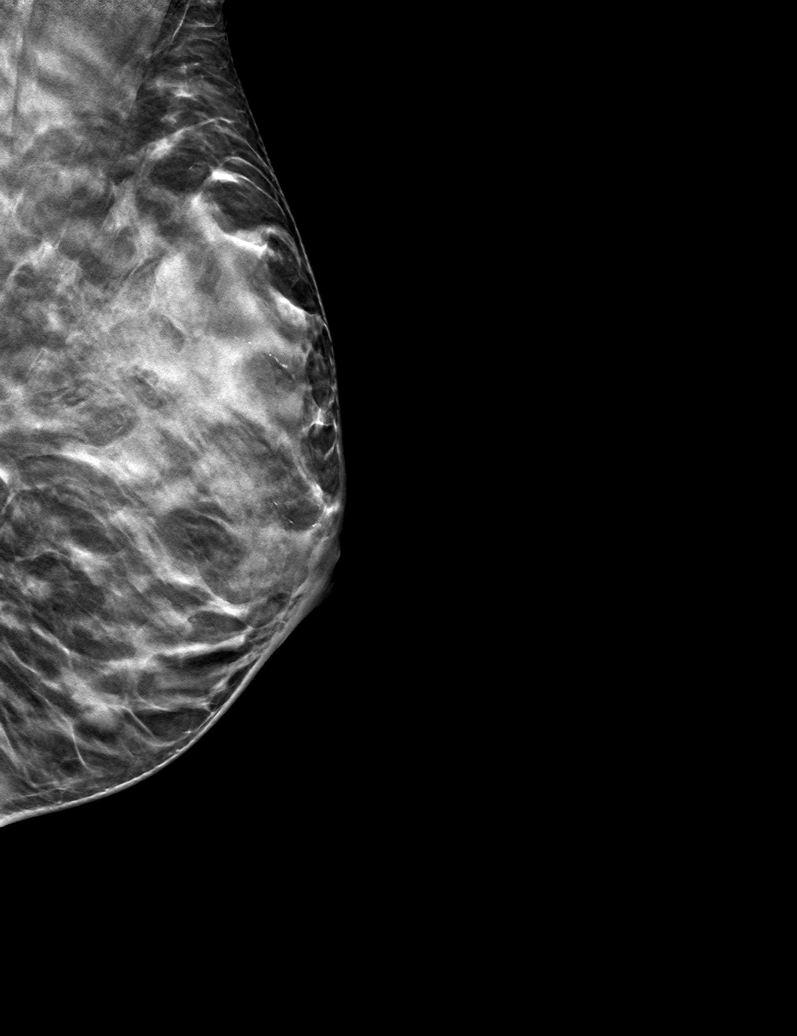

[6 of 30 positions shown; findings below may reference images not displayed]

ACR Breast Density Category c: The breast tissue is heterogeneously
dense, which may obscure small masses.
FINDINGS: No suspicious calcifications, masses or areas of distortion are seen
in the bilateral breasts. Mild vascular calcifications are noted in
both breasts.

Ultrasound targeted to the lateral aspect of the right breast
demonstrates normal dense fibroglandular tissue. No suspicious
masses or areas of shadowing are identified.

Ultrasound targeted to the superior and the lateral aspect of the
left breast demonstrates normal fibroglandular tissue. No suspicious
masses or areas of shadowing are identified.
IMPRESSION: 1. There are no suspicious mammographic or targeted sonographic
abnormalities at the palpable sites of concern in both breasts.

2. There are mild bilateral vascular calcifications in both breasts,
which is unusual in a patient of this age.

3.  No mammographic evidence of malignancy in the bilateral breasts.

RECOMMENDATION:
1. Clinical follow-up recommended for the palpable and tender areas
of concern in the bilateral breasts. Any further workup should be
based on clinical grounds.

2. Consider cardiovascular risk assessment due to early vascular
calcifications seen on mammography.

3. Screening mammogram at age 40 unless there are persistent or
intervening clinical concerns. (Code:NT-E-CJD)

I have discussed the findings and recommendations with the patient.
If applicable, a reminder letter will be sent to the patient
regarding the next appointment.

BI-RADS CATEGORY  1: Negative.

## 2023-08-14 ENCOUNTER — Telehealth: Payer: Self-pay

## 2023-08-14 NOTE — Telephone Encounter (Signed)
 Copied from CRM [id number]. Topic: Clinical - Request for Lab/Test Order >> Aug 14, 2023 10:38 AM Opal Bill wrote: Reason for CRM: Pt scheduled physical for 08/25/23. Requesting blood work be ordered before appointment, if possible. Please contact patient. [phone]

## 2023-08-14 NOTE — Telephone Encounter (Signed)
Called patient and informed. Patient verbalized understanding.  

## 2023-08-25 ENCOUNTER — Encounter: Payer: Self-pay | Admitting: Family Medicine

## 2023-08-25 ENCOUNTER — Ambulatory Visit (INDEPENDENT_AMBULATORY_CARE_PROVIDER_SITE_OTHER): Admitting: Family Medicine

## 2023-08-25 VITALS — BP 122/80 | HR 100 | Resp 16 | Ht 61.0 in | Wt 94.0 lb

## 2023-08-25 DIAGNOSIS — R718 Other abnormality of red blood cells: Secondary | ICD-10-CM | POA: Diagnosis not present

## 2023-08-25 DIAGNOSIS — E782 Mixed hyperlipidemia: Secondary | ICD-10-CM

## 2023-08-25 DIAGNOSIS — R634 Abnormal weight loss: Secondary | ICD-10-CM

## 2023-08-25 DIAGNOSIS — Z7689 Persons encountering health services in other specified circumstances: Secondary | ICD-10-CM

## 2023-08-25 DIAGNOSIS — E079 Disorder of thyroid, unspecified: Secondary | ICD-10-CM | POA: Diagnosis not present

## 2023-08-25 DIAGNOSIS — Z0000 Encounter for general adult medical examination without abnormal findings: Secondary | ICD-10-CM

## 2023-08-25 DIAGNOSIS — F1320 Sedative, hypnotic or anxiolytic dependence, uncomplicated: Secondary | ICD-10-CM

## 2023-08-25 DIAGNOSIS — Z5181 Encounter for therapeutic drug level monitoring: Secondary | ICD-10-CM

## 2023-08-25 DIAGNOSIS — E46 Unspecified protein-calorie malnutrition: Secondary | ICD-10-CM

## 2023-08-25 DIAGNOSIS — F4323 Adjustment disorder with mixed anxiety and depressed mood: Secondary | ICD-10-CM

## 2023-08-25 DIAGNOSIS — Z124 Encounter for screening for malignant neoplasm of cervix: Secondary | ICD-10-CM

## 2023-08-25 DIAGNOSIS — E063 Autoimmune thyroiditis: Secondary | ICD-10-CM | POA: Diagnosis not present

## 2023-08-25 DIAGNOSIS — R110 Nausea: Secondary | ICD-10-CM

## 2023-08-25 MED ORDER — PANTOPRAZOLE SODIUM 40 MG PO TBEC: 40.0000 mg | DELAYED_RELEASE_TABLET | Freq: Every day | ORAL | 1 refills | Status: DC

## 2023-08-25 NOTE — Progress Notes (Signed)
 Name: Tamara Walker   MRN: [id number]    DOB: [birth year]   Date:08/25/2023       Progress Note  Chief Complaint  Patient presents with   Annual Exam  Multiple health concerns - so CPE was not done - focused on current sx and concerns   Subjective:   Tamara Walker is a 38 y.o. female, presents to clinic for routine follow up on chronic conditions  She had changed her diet sig for cholesterol and she's having trouble maintaining weight, she had cut out fried foods She is also having nausea sometimes upon waking, or when she gets upset Wt Readings from Last 5 Encounters:  08/25/23 94 lb (42.6 kg)  03/16/21 108 lb 11.2 oz (49.3 kg)  11/02/20 116 lb 11.2 oz (52.9 kg)  05/31/20 124 lb 12.8 oz (56.6 kg)  11/15/19 146 lb 14.4 oz (66.6 kg)   BMI Readings from Last 5 Encounters:  08/25/23 17.76 kg/m  03/16/21 20.54 kg/m  11/02/20 22.05 kg/m  05/31/20 23.58 kg/m  11/15/19 27.76 kg/m   She went to pcp in [place]  and endocrinology (has been in [place]  for the past 2 years, just came back) Did a lot of labs and tests - some on phone but she has records at home that she will bring in for us  to copy [place]  cardiologist, endocrinology  [facility] - Tamara O'callaghan np [place]  CT labs ECHO    She is very stressed, has conflict and issues with her daughter, she's very emotional today, does state that stress and emotions have effected appetite, she reports trying multiple meds in the past w/o improvement including Zoloft  lexapro  Reviewed this emr for all past meds and I can see past buspar , hydroxyzine  and zoloft  50 mg  Per chart and PDMP she is currently on xanax TID per her [place]  PCP Tamara Ocallaghan     08/25/2023    8:46 AM 03/16/2021    2:23 PM 11/02/2020    2:37 PM 05/31/2020   11:07 AM 04/07/2020   10:11 AM  Depression screen PHQ 2/9  Decreased Interest 3 2 1 1  0  Down, Depressed, Hopeless 3 2 2 1  0  PHQ - 2 Score 6 4 3 2  0  Altered sleeping 3 3 3 3  0   Tired, decreased energy 3 3 3 3  0  Change in appetite 3 3 2 1  0  Feeling bad or failure about yourself  3 2 0 0 0  Trouble concentrating 3 1 3 1  0  Moving slowly or fidgety/restless 0 1 3 1  0  Suicidal thoughts 1 1 0 1 0  PHQ-9 Score 22 18 17 12  0  Difficult doing work/chores   Very difficult Somewhat difficult Not difficult at all      08/25/2023    8:49 AM 03/16/2021    2:24 PM 05/31/2020   11:08 AM 04/07/2020   10:12 AM  GAD 7 : Generalized Anxiety Score  Nervous, Anxious, on Edge 3 3 3 1   Control/stop worrying 3 2 2  0  Worry too much - different things 3 2 3  0  Trouble relaxing 3 3 3 1   Restless 3 3 3  0  Easily annoyed or irritable 3 3 3 1   Afraid - awful might happen 0 1 0 0  Total GAD 7 Score 18 17 17 3   Anxiety Difficulty  Somewhat difficult Somewhat difficult Not difficult at all       Current Outpatient Medications:    ALPRAZolam (  XANAX) 1 MG tablet, Take 1 mg by mouth 3 (three) times daily., Disp: , Rfl:    levothyroxine  (SYNTHROID ) 75 MCG tablet, Take 1 tablet (75 mcg total) by mouth daily., Disp: 90 tablet, Rfl: 2  Patient Active Problem List   Diagnosis Date Noted   Moderate episode of recurrent major depressive disorder (HCC) 06/01/2020   Mixed hyperlipidemia 05/21/2018   Elevated glucose 05/20/2018   Paternal family history of dementia 05/18/2018   Family history of diabetes mellitus 05/18/2018   Tobacco abuse 05/18/2018   Microcytic erythrocytes 03/18/2017   Vitamin D  deficiency 11/08/2016   Well woman exam without gynecological exam 05/03/2016   Hashimoto's thyroiditis 10/31/2015   Thyroid  disease 01/03/2015   Thyroid  nodule 01/03/2015    Past Surgical History:  Procedure Laterality Date   CESAREAN SECTION     EYE SURGERY     TUBAL LIGATION  2016    Family History  Problem Relation Age of Onset   Dementia Father 76       Died in his early 23's   Heart attack Father 64       Died in his early 51's   Diabetes Maternal Grandfather    Diabetes  Paternal Grandmother    Dementia Paternal Grandmother 59   Breast cancer Maternal Aunt    Cancer Neg Hx    Heart disease Neg Hx     Social History   Tobacco Use   Smoking status: Every Day    Current packs/day: 0.25    Average packs/day: 0.3 packs/day for 15.0 years (3.8 ttl pk-yrs)    Types: Cigarettes   Smokeless tobacco: Never   Tobacco comments:    3-7 cigars a day  Vaping Use   Vaping status: Never Used  Substance Use Topics   Alcohol use: Not Currently    Comment: occasional 1-2x/yr   Drug use: No     No Known Allergies  Health Maintenance  Topic Date Due   Pneumococcal Vaccine 32-39 Years old (1 of 2 - PCV) Never done   Cervical Cancer Screening (Pap smear)  02/11/2022   Cervical Cancer Screening (HPV/Pap Cotest)  02/11/2022   COVID-19 Vaccine (1 - 2024-25 season) 09/07/2023 (Originally 11/03/2022)   HPV VACCINES (1 - 3-dose SCDM series) 08/21/2024 (Originally 06/18/2012)   INFLUENZA VACCINE  10/03/2023   DTaP/Tdap/Td (2 - Td or Tdap) 05/04/2026   Hepatitis C Screening  Completed   HIV Screening  Completed   Meningococcal B Vaccine  Aged Out    Chart Review Today: I personally reviewed active problem list, medication list, allergies, family history, social history, health maintenance, notes from last encounter, lab results, imaging with the patient/caregiver today.   Review of Systems  Constitutional: Negative.   HENT: Negative.    Eyes: Negative.   Respiratory: Negative.    Cardiovascular: Negative.   Gastrointestinal: Negative.   Endocrine: Negative.   Genitourinary: Negative.   Musculoskeletal: Negative.   Skin: Negative.   Allergic/Immunologic: Negative.   Neurological: Negative.   Hematological: Negative.   Psychiatric/Behavioral: Negative.    All other systems reviewed and are negative.    Objective:   Vitals:   08/25/23 0843  BP: 122/80  Pulse: 100  Resp: 16  SpO2: 97%  Weight: 94 lb (42.6 kg)  Height: 5' 1 (1.549 m)    Body mass  index is 17.76 kg/m.  Physical Exam Vitals and nursing note reviewed.  Constitutional:      General: She is not in acute distress.  Appearance: Normal appearance. She is well-developed and underweight. She is not ill-appearing, toxic-appearing or diaphoretic.  HENT:     Head: Normocephalic and atraumatic.     Right Ear: External ear normal.     Left Ear: External ear normal.     Nose: Nose normal.   Eyes:     General: No scleral icterus.       Right eye: No discharge.        Left eye: No discharge.     Conjunctiva/sclera: Conjunctivae normal.   Neck:     Trachea: No tracheal deviation.   Cardiovascular:     Rate and Rhythm: Normal rate.  Pulmonary:     Effort: Pulmonary effort is normal. No respiratory distress.     Breath sounds: No stridor.   Skin:    General: Skin is warm and dry.     Findings: No rash.   Neurological:     Mental Status: She is alert.     Motor: No abnormal muscle tone.     Coordination: Coordination normal.     Gait: Gait normal.   Psychiatric:        Mood and Affect: Mood is anxious and depressed. Affect is tearful.        Speech: Speech normal.        Behavior: Behavior is cooperative.      Functional Status Survey: Is the patient deaf or have difficulty hearing?: No Does the patient have difficulty seeing, even when wearing glasses/contacts?: No Does the patient have difficulty concentrating, remembering, or making decisions?: No Does the patient have difficulty walking or climbing stairs?: No Does the patient have difficulty dressing or bathing?: No Does the patient have difficulty doing errands alone such as visiting a doctor's office or shopping?: No Results for orders placed or performed in visit on 04/19/21  TSH   Collection Time: 04/19/21 11:18 AM  Result Value Ref Range   TSH 1.88 mIU/L      Assessment & Plan:     ICD-10-CM   1. Encounter to establish care with new doctor  Z76.89    PT has been in [place]  with other  PCP management for ~2 years, request records, updated chart as able today    2. Microcytic erythrocytes  R71.8 CBC with Differential/Platelet   recheck labs and trend    3. Mixed hyperlipidemia  E78.2 Comprehensive metabolic panel with GFR    Lipid panel   off statin currently, was working on diet changes which has lead to weight loss, recheck lipids    4. Thyroid  disease  E07.9 TSH   on levothyroxine  75 mcg, recheck TSH today and adjust meds if needed Last labs from [place]  PCP the TSH was in normal limits    5. Hashimoto's thyroiditis  E06.3 TSH   get records from [place]  PCP and endo, labs recently done in [place] , March TSH was normal antibodies were elevated    6. Encounter for medication monitoring  Z51.81 CBC with Differential/Platelet    Comprehensive metabolic panel with GFR    TSH    Lipid panel    Hemoglobin A1c    7. Protein-calorie malnutrition, unspecified severity (HCC)  E46 Amb ref to Medical Nutrition Therapy-MNT   encouraged her to add calories to diet, healthy fats and protein, RD referral    8. Unintended weight loss  R63.4    thyroid  checked, [place]  pcp did CT chest/ab/pelvis neg, cardiac and endo work up    9. 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    a lot of stress with child, severe sx, she reports failing many meds in the past, discussed zoloft , est with specialists to help manage sx PHQ and GAD 7 positive and unfortunately right now pt seems to only be on xanax and no other meds for moods/stress/anxiety/depression    08/25/2023    8:46 AM 03/16/2021    2:23 PM 11/02/2020    2:37 PM  Depression screen PHQ 2/9  Decreased Interest 3 2 1   Down, Depressed, Hopeless 3 2 2   PHQ - 2 Score 6 4 3   Altered sleeping 3 3 3   Tired, decreased energy 3 3 3   Change in appetite 3 3 2   Feeling bad or failure about yourself  3 2 0  Trouble concentrating 3 1 3   Moving slowly or fidgety/restless 0 1 3  Suicidal thoughts 1 1 0  PHQ-9 Score 22 18 17    Difficult doing work/chores   Very difficult      08/25/2023    8:49 AM 03/16/2021    2:24 PM 05/31/2020   11:08 AM 04/07/2020   10:12 AM  GAD 7 : Generalized Anxiety Score  Nervous, Anxious, on Edge 3 3 3 1   Control/stop worrying 3 2 2  0  Worry too much - different things 3 2 3  0  Trouble relaxing 3 3 3 1   Restless 3 3 3  0  Easily annoyed or irritable 3 3 3 1   Afraid - awful might happen 0 1 0 0  Total GAD 7 Score 18 17 17 3   Anxiety Difficulty  Somewhat difficult Somewhat difficult Not difficult at all   Will need to get her est with psych locally      10. Nausea  R11.0 pantoprazole (PROTONIX) 40 MG tablet   associated with emotional upset, also present upon waking some mornings, could try PPi/pepcid    11. Benzodiazepine dependence (HCC)  F13.20    per [place]  PCP, reviewed pdmp She will need to taper off to avoid withdrawal       Requesting records from PCP, cardiology, endo and lab results Recent CT and cardiac work up   Return for reschedule CPE another time (1-2 months) .   Michelene Cower, PA-C 08/25/23 9:13 AM

## 2023-08-26 LAB — CBC WITH DIFFERENTIAL/PLATELET
Absolute Lymphocytes: 2348 {cells}/uL (ref 850–3900)
Absolute Monocytes: 390 {cells}/uL (ref 200–950)
Basophils Absolute: 31 {cells}/uL (ref 0–200)
Basophils Relative: 0.4 %
Eosinophils Absolute: 62 {cells}/uL (ref 15–500)
Eosinophils Relative: 0.8 %
HCT: 43.8 % (ref 35.0–45.0)
Hemoglobin: 13 g/dL (ref 11.7–15.5)
MCH: 23.6 pg — ABNORMAL LOW (ref 27.0–33.0)
MCHC: 29.7 g/dL — ABNORMAL LOW (ref 32.0–36.0)
MCV: 79.3 fL — ABNORMAL LOW (ref 80.0–100.0)
MPV: 10.3 fL (ref 7.5–12.5)
Monocytes Relative: 5 %
Neutro Abs: 4969 {cells}/uL (ref 1500–7800)
Neutrophils Relative %: 63.7 %
Platelets: 324 10*3/uL (ref 140–400)
RBC: 5.52 10*6/uL — ABNORMAL HIGH (ref 3.80–5.10)
RDW: 12.7 % (ref 11.0–15.0)
Total Lymphocyte: 30.1 %
WBC: 7.8 10*3/uL (ref 3.8–10.8)

## 2023-08-26 LAB — COMPREHENSIVE METABOLIC PANEL WITH GFR
AG Ratio: 2.3 (calc) (ref 1.0–2.5)
ALT: 12 U/L (ref 6–29)
AST: 15 U/L (ref 10–30)
Albumin: 5.3 g/dL — ABNORMAL HIGH (ref 3.6–5.1)
Alkaline phosphatase (APISO): 73 U/L (ref 31–125)
BUN: 9 mg/dL (ref 7–25)
CO2: 26 mmol/L (ref 20–32)
Calcium: 10.1 mg/dL (ref 8.6–10.2)
Chloride: 103 mmol/L (ref 98–110)
Creat: 0.77 mg/dL (ref 0.50–0.97)
Globulin: 2.3 g/dL (ref 1.9–3.7)
Glucose, Bld: 110 mg/dL — ABNORMAL HIGH (ref 65–99)
Potassium: 3.8 mmol/L (ref 3.5–5.3)
Sodium: 138 mmol/L (ref 135–146)
Total Bilirubin: 0.7 mg/dL (ref 0.2–1.2)
Total Protein: 7.6 g/dL (ref 6.1–8.1)
eGFR: 101 mL/min/{1.73_m2} (ref >=60)

## 2023-08-26 LAB — LIPID PANEL
Cholesterol: 200 mg/dL — ABNORMAL HIGH (ref <200)
HDL: 57 mg/dL (ref >=50)
LDL Cholesterol (Calc): 120 mg/dL — ABNORMAL HIGH
Non-HDL Cholesterol (Calc): 143 mg/dL — ABNORMAL HIGH (ref <130)
Total CHOL/HDL Ratio: 3.5 (calc) (ref <5.0)
Triglycerides: 122 mg/dL (ref <150)

## 2023-08-26 LAB — HEMOGLOBIN A1C
Hgb A1c MFr Bld: 5.5 % (ref <5.7)
Mean Plasma Glucose: 111 mg/dL
eAG (mmol/L): 6.2 mmol/L

## 2023-08-26 LAB — TSH: TSH: 1.11 m[IU]/L

## 2023-08-27 ENCOUNTER — Ambulatory Visit: Payer: Self-pay | Admitting: Family Medicine

## 2023-09-01 ENCOUNTER — Encounter: Payer: Self-pay | Admitting: Dietician

## 2023-09-01 ENCOUNTER — Encounter: Attending: Family Medicine | Admitting: Dietician

## 2023-09-01 VITALS — Ht 61.0 in | Wt 98.0 lb

## 2023-09-01 DIAGNOSIS — E063 Autoimmune thyroiditis: Secondary | ICD-10-CM

## 2023-09-01 DIAGNOSIS — E46 Unspecified protein-calorie malnutrition: Secondary | ICD-10-CM

## 2023-09-01 NOTE — Patient Instructions (Signed)
 Stop drinking any drinks about 15-30 minutes before eating a meal, to ensure stomach is empty enough to eat a full meal. Set alarms to eat about every 3 hours during the day.  Try a smoothie or nutrition drink during the morning Johnny some foods in oil to add calories.  Include some fruits for snacks, ok to combine with jello, pudding or ice cream or other dip.  Try a snack mix of dry cereal, walnuts, some chocolate (m&ms don't melt as quick as chocolate chips)

## 2023-09-01 NOTE — Progress Notes (Signed)
 Medical Nutrition Therapy: Visit start time: 1100  end time: 1200  Assessment:   Referral Diagnosis: protein-calorie malnutrition Other medical history/ diagnoses: Hashimoto's thyroiditis, hyperlipidemia, vitamin D  deficiency Psychosocial issues/ stress concerns: anxiety  Medications, supplements: reconciled list in medical record    Current weight: 98lbs  Height: 5'1 BMI: 18.52    Progress and evaluation:  Patient reports recent 20lbs weight loss in 1 week, due to Hashimoto's disease. Levothyroxine  dose has been increased in effort to help.  She reports trying ot eat as much as possible, even higher fat foods despite hx of hyperlipidemia.  Reports selective eating; does not like veg other than corn; likes strawberries, grapes; some poor quality food shere living previously Tried ensure but did not work.  Food allergies: no known allergies; some lactose sensitivity per patient Next PCP appt is 10/2023   Dietary Intake:  Usual eating pattern includes 2 meals and several snacks per day.  Who plans meals/ buys groceries? self Who prepares meals? self  Breakfast: usually skips, not hungry, needs to wait after taking synthroid  Snack: none or same as pm Lunch: 11am - 2 or 3pm -- meat, rice beans; occ pizza/ burger/ chinese Snack: chips/  Supper: 5pm - 8:30pm -- same as lunch. Does not eat pasta Snack: same as pm Beverages: water, white cranberry juice, ginger ale, occasional energy drink  Physical activity: none recently; started some exercise 2 days ago   Intervention:   Nutrition Care Education:   Basic nutrition: basic food groups; appropriate nutrient balance; appropriate meal and snack schedule; general nutrition guidelines    Weight gain: eating at frequent intervals; choosing calorie-dense foods; adding healthy fats or protein to foods; eating high-cal foods first at meals; setting alarms as reminders to eat; engaging in stress-reducing activities prior to eating; engaging  in light exercise regularly to stimulate appetite; estimated energy needs for weight gain at 1700 kcal, provided guidance for 45% CHO, 25% pro, (30% fat) Advanced nutrition:  cooking techniques-- suitable, simple options for foods high in caloric density Hyperlipidemia: healthy and unhealthy fats;  Other intervention notes: Patient voices readiness to make changes Established nutrition goals with direction from patient.    Nutritional Diagnosis:  [place]-3.2 Unintentional weight loss As related to hashimoto's thyroiditis.  As evidenced by loss of >10% body weight in < 1 month. [id number] Predicted suboptimal nutrient intake As related to selective eating, skipped meals.  As evidenced by patient report of dietary intake.   Education Materials given:  Gaining [employer] in a Engineer, site with food lists, sample meal pattern Visit summary with goals/ instructions   Learner/ who was taught:  Patient    Level of understanding: Verbalizes/ demonstrates competency  Demonstrated degree of understanding via:   Teach back Learning barriers: None  Willingness to learn/ readiness for change: Eager, change in progress  Monitoring and Evaluation:  Dietary intake, exercise, and body weight      follow up: 10/08/23 at 8:30 am

## 2023-10-08 ENCOUNTER — Ambulatory Visit: Admitting: Dietician

## 2023-10-29 ENCOUNTER — Encounter: Payer: Self-pay | Admitting: Family Medicine

## 2023-10-29 ENCOUNTER — Ambulatory Visit (INDEPENDENT_AMBULATORY_CARE_PROVIDER_SITE_OTHER): Admitting: Family Medicine

## 2023-10-29 ENCOUNTER — Other Ambulatory Visit (HOSPITAL_COMMUNITY)
Admission: RE | Admit: 2023-10-29 | Discharge: 2023-10-29 | Disposition: A | Discharge status: Home / Self Care | Source: Ambulatory Visit | Attending: Family Medicine | Admitting: Family Medicine

## 2023-10-29 VITALS — BP 118/76 | HR 60 | Resp 16 | Ht 61.0 in | Wt 99.0 lb

## 2023-10-29 DIAGNOSIS — E063 Autoimmune thyroiditis: Secondary | ICD-10-CM | POA: Diagnosis not present

## 2023-10-29 DIAGNOSIS — Z124 Encounter for screening for malignant neoplasm of cervix: Secondary | ICD-10-CM

## 2023-10-29 DIAGNOSIS — E079 Disorder of thyroid, unspecified: Secondary | ICD-10-CM

## 2023-10-29 DIAGNOSIS — N941 Unspecified dyspareunia: Secondary | ICD-10-CM

## 2023-10-29 DIAGNOSIS — Z0000 Encounter for general adult medical examination without abnormal findings: Secondary | ICD-10-CM

## 2023-10-29 DIAGNOSIS — R21 Rash and other nonspecific skin eruption: Secondary | ICD-10-CM | POA: Diagnosis not present

## 2023-10-29 DIAGNOSIS — R634 Abnormal weight loss: Secondary | ICD-10-CM

## 2023-10-29 MED ORDER — TRIAMCINOLONE ACETONIDE 0.1 % EX CREA: 1.0000 | TOPICAL_CREAM | Freq: Two times a day (BID) | CUTANEOUS | 2 refills | Status: AC | PRN

## 2023-10-29 MED ORDER — LEVOTHYROXINE SODIUM 25 MCG PO TABS: ORAL_TABLET | ORAL | 0 refills | Status: AC

## 2023-10-29 NOTE — Progress Notes (Signed)
 Patient: Tamara Walker, Female    DOB: Jul 12, 1985, 38 y.o.   MRN: [id number] Leavy Mole, PA-C Visit Date: 10/29/2023  Today's Provider: Mole Leavy, PA-C   Chief Complaint  Patient presents with   Annual Exam   Subjective:   Annual physical exam:  Tamara Walker is a 38 y.o. female who presents today for complete physical exam:  Exercise/Activity:  no  Diet/nutrition:  trying to eat more and unhealthy to gain weight Sleep:  sleeping ok  SDOH Screenings   Food Insecurity: No Food Insecurity (08/25/2023)  Housing: Unknown (08/25/2023)  Transportation Needs: No Transportation Needs (08/25/2023)  Utilities: Not At Risk (08/25/2023)  Alcohol Screen: Low Risk  (08/25/2023)  Depression (PHQ2-9): Low Risk  (10/29/2023)  Recent Concern: Depression (PHQ2-9) - High Risk (08/25/2023)  Financial Resource Strain: Low Risk  (08/25/2023)  Physical Activity: Inactive (08/25/2023)  Social Connections: Socially Isolated (08/25/2023)  Stress: No Stress Concern Present (08/25/2023)  Tobacco Use: High Risk (10/29/2023)  Health Literacy: Adequate Health Literacy (08/25/2023)    USPSTF grade A and B recommendations - reviewed and addressed today  Depression:  Phq 9 completed today by patient, was reviewed by me with patient in the room PHQ score is neg, pt feels still stressed and anxious    10/29/2023    9:02 AM 09/01/2023   11:04 AM 08/25/2023    8:46 AM 03/16/2021    2:23 PM  PHQ 2/9 Scores  PHQ - 2 Score 0 0 6 4  PHQ- 9 Score   22 18      10/29/2023    9:02 AM 09/01/2023   11:04 AM 08/25/2023    8:46 AM 03/16/2021    2:23 PM 11/02/2020    2:37 PM  Depression screen PHQ 2/9  Decreased Interest 0 0 3 2 1   Down, Depressed, Hopeless 0 0 3 2 2   PHQ - 2 Score 0 0 6 4 3   Altered sleeping   3 3 3   Tired, decreased energy   3 3 3   Change in appetite   3 3 2   Feeling bad or failure about yourself    3 2 0  Trouble concentrating   3 1 3   Moving slowly or fidgety/restless   0 1 3  Suicidal  thoughts   1 1 0  PHQ-9 Score   22 18 17   Difficult doing work/chores     Very difficult   Alcohol screening: Flowsheet Row Office Visit from 08/25/2023 in [facility]  AUDIT-C Score 1   Immunizations and Health Maintenance: Health Maintenance  Topic Date Due   Cervical Cancer Screening (Pap smear)  02/11/2022   Cervical Cancer Screening (HPV/Pap Cotest)  02/11/2022   COVID-19 Vaccine (2 - 2024-25 season) 11/13/2023 (Originally 11/03/2022)   INFLUENZA VACCINE  06/01/2024 (Originally 10/03/2023)   HPV VACCINES (1 - 3-dose SCDM series) 08/21/2024 (Originally 06/18/2012)   Hepatitis B Vaccines 19-59 Average Risk (1 of 3 - 19+ 3-dose series) 10/27/2024 (Originally 06/18/2004)   Pneumococcal Vaccine (1 of 2 - PCV) 10/28/2024 (Originally 06/18/2004)   DTaP/Tdap/Td (3 - Td or Tdap) 05/03/2028   Hepatitis C Screening  Completed   HIV Screening  Completed   Meningococcal B Vaccine  Aged Out    Hep C Screening: doe  STD testing and prevention (HIV/chl/gon/syphilis):  see above, no additional testing desired by pt today  Intimate partner violence:  safe, denies abuse   Sexual History/Pain during Intercourse: Married Hurts, uncomfortable, suspects it has something to do  with lower weight the last year or so  Menstrual History/LMP/Abnormal Bleeding:  regular cycle  Patient's last menstrual period was 10/20/2023.  Incontinence Symptoms:  No sx or concerns, very rare   Breast cancer:  due at age 4 reviewed her past imaging and US  recommendations  Last Mammogram: *see HM list above  Cervical cancer screening: due   Osteoporosis:   Discussion on osteoporosis per age, including high calcium  and vitamin D  supplementation, weight bearing exercises  Skin cancer:  Hx of skin CA -  NO   Skin concerns - needs Rx  Discussed atypical lesions   Colorectal cancer:   Colonoscopy is not due per age    Lung cancer:   Low Dose CT Chest recommended if Age 24-80 years, 20  pack-year currently smoking OR have quit w/in 15years. Patient does not qualify.    Social History   Tobacco Use   Smoking status: Every Day    Current packs/day: 0.25    Average packs/day: 0.3 packs/day for 15.0 years (3.8 ttl pk-yrs)    Types: Cigarettes   Smokeless tobacco: Never   Tobacco comments:    3-7 cigarettes a day  Vaping Use   Vaping status: Never Used  Substance Use Topics   Alcohol use: Not Currently    Comment: occasional 1-2x/yr   Drug use: No     Flowsheet Row Office Visit from 08/25/2023 in [facility]  AUDIT-C Score 1    Family History  Problem Relation Age of Onset   Dementia Father 62       Died in his early 18's   Heart attack Father 43       Died in his early 42's   Diabetes Maternal Grandfather    Diabetes Paternal Grandmother    Dementia Paternal Grandmother 69   Breast cancer Maternal Aunt    Cancer Neg Hx    Heart disease Neg Hx      Blood pressure/Hypertension: BP Readings from Last 3 Encounters:  10/29/23 118/76  08/25/23 122/80  03/16/21 110/62    Weight/Obesity: Wt Readings from Last 3 Encounters:  10/29/23 99 lb (44.9 kg)  09/01/23 98 lb (44.5 kg)  08/25/23 94 lb (42.6 kg)   BMI Readings from Last 3 Encounters:  10/29/23 18.71 kg/m  09/01/23 18.52 kg/m  08/25/23 17.76 kg/m     Lipids:  Lab Results  Component Value Date   CHOL 200 (H) 08/25/2023   CHOL 153 03/16/2021   CHOL 165 10/17/2020   Lab Results  Component Value Date   HDL 57 08/25/2023   HDL 62 03/16/2021   HDL 75 10/17/2020   Lab Results  Component Value Date   LDLCALC 120 (H) 08/25/2023   LDLCALC 76 03/16/2021   LDLCALC 76 10/17/2020   Lab Results  Component Value Date   TRIG 122 08/25/2023   TRIG 73 03/16/2021   TRIG 55 10/17/2020   Lab Results  Component Value Date   CHOLHDL 3.5 08/25/2023   CHOLHDL 2.5 03/16/2021   CHOLHDL 2.2 10/17/2020   No results found for: LDLDIRECT Based on the results of lipid  panel his/her cardiovascular risk factor ( using [place] Cohort )  in the next 10 years is: The ASCVD Risk score (Arnett DK, et al., 2019) failed to calculate for the following reasons:   The 2019 ASCVD risk score is only valid for ages 33 to 69  Glucose:  Glucose, Bld  Date Value Ref Range Status  08/25/2023 110 (H) 65 -  99 mg/dL Final    Comment:    .            Fasting reference interval . For someone without known diabetes, a glucose value between 100 and 125 mg/dL is consistent with prediabetes and should be confirmed with a follow-up test. .   03/16/2021 129 (H) 65 - 99 mg/dL Final    Comment:    .            Fasting reference interval . For someone without known diabetes, a glucose value >125 mg/dL indicates that they may have diabetes and this should be confirmed with a follow-up test. .   10/17/2020 126 (H) 65 - 99 mg/dL Final    Comment:    .            Fasting reference interval . For someone without known diabetes, a glucose value >125 mg/dL indicates that they may have diabetes and this should be confirmed with a follow-up test. .     Advanced Care Planning:  A voluntary discussion about advance care planning including the explanation and discussion of advance directives.   Discussed health care proxy and Living will, and the patient was able to identify a health care proxy as spouse.  Social History       Social History   Socioeconomic History   Marital status: Married    Spouse name: Lynwood   Number of children: 3   Years of education: Not on file   Highest education level: Not on file  Occupational History   Occupation: unemployed  Tobacco Use   Smoking status: Every Day    Current packs/day: 0.25    Average packs/day: 0.3 packs/day for 15.0 years (3.8 ttl pk-yrs)    Types: Cigarettes   Smokeless tobacco: Never   Tobacco comments:    3-7 cigarettes a day  Vaping Use   Vaping status: Never Used  Substance and Sexual Activity   Alcohol  use: Not Currently    Comment: occasional 1-2x/yr   Drug use: No   Sexual activity: Yes    Partners: Male    Birth control/protection: Surgical  Other Topics Concern   Not on file  Social History Narrative   Not on file   Social Drivers of Health   Financial Resource Strain: Low Risk  (08/25/2023)   Overall Financial Resource Strain (CARDIA)    Difficulty of Paying Living Expenses: Not hard at all  Food Insecurity: No Food Insecurity (08/25/2023)   Hunger Vital Sign    Worried About Running Out of Food in the Last Year: Never true    Ran Out of Food in the Last Year: Never true  Transportation Needs: No Transportation Needs (08/25/2023)   PRAPARE - Administrator, Civil Service (Medical): No    Lack of Transportation (Non-Medical): No  Physical Activity: Inactive (08/25/2023)   Exercise Vital Sign    Days of Exercise per Week: 0 days    Minutes of Exercise per Session: 0 min  Stress: No Stress Concern Present (08/25/2023)   [employer] of Occupational Health - Occupational Stress Questionnaire    Feeling of Stress: Not at all  Social Connections: Socially Isolated (08/25/2023)   Social Connection and Isolation Panel    Frequency of Communication with Friends and Family: Twice a week    Frequency of Social Gatherings with Friends and Family: Never    Attends Religious Services: Never    Database administrator or Organizations: No  Attends Banker Meetings: Never    Marital Status: Married    Family History        Family History  Problem Relation Age of Onset   Dementia Father 12       Died in his early 66's   Heart attack Father 3       Died in his early 39's   Diabetes Maternal Grandfather    Diabetes Paternal Grandmother    Dementia Paternal Grandmother 18   Breast cancer Maternal Aunt    Cancer Neg Hx    Heart disease Neg Hx     Patient Active Problem List   Diagnosis Date Noted   Moderate episode of recurrent major depressive  disorder (HCC) 06/01/2020   Mixed hyperlipidemia 05/21/2018   Elevated glucose 05/20/2018   Paternal family history of dementia 05/18/2018   Family history of diabetes mellitus 05/18/2018   Tobacco abuse 05/18/2018   Microcytic erythrocytes 03/18/2017   Vitamin D  deficiency 11/08/2016   Well woman exam without gynecological exam 05/03/2016   Hashimoto's thyroiditis 10/31/2015   Thyroid  disease 01/03/2015   Thyroid  nodule 01/03/2015    Past Surgical History:  Procedure Laterality Date   CESAREAN SECTION     EYE SURGERY     TUBAL LIGATION  2016     Current Outpatient Medications:    ALPRAZolam (XANAX) 1 MG tablet, Take 1 mg by mouth 3 (three) times daily., Disp: , Rfl:    levothyroxine  (SYNTHROID ) 75 MCG tablet, Take 1 tablet (75 mcg total) by mouth daily., Disp: 90 tablet, Rfl: 2   pantoprazole  (PROTONIX ) 40 MG tablet, Take 1 tablet (40 mg total) by mouth at bedtime., Disp: 30 tablet, Rfl: 1  No Known Allergies  Patient Care Team: Leavy Mole, PA-C as PCP - General (Family Medicine)   Chart Review: I personally reviewed active problem list, medication list, allergies, family history, social history, health maintenance, notes from last encounter, lab results, imaging with the patient/caregiver today.   Review of Systems  Constitutional: Negative.   HENT: Negative.    Eyes: Negative.   Respiratory: Negative.    Cardiovascular: Negative.   Gastrointestinal: Negative.   Endocrine: Negative.   Genitourinary: Negative.   Musculoskeletal: Negative.   Skin: Negative.   Allergic/Immunologic: Negative.   Neurological: Negative.   Hematological: Negative.   Psychiatric/Behavioral: Negative.    All other systems reviewed and are negative.         Objective:   Vitals:  Vitals:   10/29/23 0919  BP: 118/76  Pulse: 60  Resp: 16  SpO2: 98%  Weight: 99 lb (44.9 kg)  Height: 5' 1 (1.549 m)    Body mass index is 18.71 kg/m.  Physical Exam Vitals and nursing note  reviewed. Exam conducted with a chaperone present.  Constitutional:      General: She is not in acute distress.    Appearance: Normal appearance. She is well-developed, well-groomed and underweight. She is not ill-appearing, toxic-appearing or diaphoretic.  HENT:     Head: Normocephalic and atraumatic.     Right Ear: External ear normal.     Left Ear: External ear normal.     Nose: Nose normal.  Eyes:     General: No scleral icterus.       Right eye: No discharge.        Left eye: No discharge.     Conjunctiva/sclera: Conjunctivae normal.  Neck:     Trachea: No tracheal deviation.  Cardiovascular:     Rate  and Rhythm: Normal rate.  Pulmonary:     Effort: Pulmonary effort is normal. No respiratory distress.     Breath sounds: No stridor.  Genitourinary:    General: Normal vulva.     Vagina: Normal.     Cervix: Normal. No cervical motion tenderness, discharge, friability, lesion, erythema, cervical bleeding or eversion.     Uterus: Normal.      Adnexa:        Right: Tenderness present. No mass or fullness.         Left: Tenderness present. No mass or fullness.       Comments: Pap obtained with brush Skin:    General: Skin is warm and dry.     Findings: No rash.  Neurological:     Mental Status: She is alert.     Motor: No abnormal muscle tone.     Coordination: Coordination normal.     Gait: Gait normal.  Psychiatric:        Mood and Affect: Mood and affect normal.        Speech: Speech is tangential.        Behavior: Behavior is hyperactive. Behavior is cooperative.       Fall Risk:    10/29/2023    9:02 AM 09/01/2023   11:04 AM 08/25/2023    8:37 AM 03/16/2021    2:23 PM 11/02/2020    2:37 PM  Fall Risk   Falls in the past year? 0 0 0 0 0  Number falls in past yr: 0  0 0 0  Injury with Fall? 0  0 0 0  Risk for fall due to : No Fall Risks  No Fall Risks    Follow up Falls prevention discussed  Falls prevention discussed Falls evaluation completed       Data  saved with a previous flowsheet row definition    Functional Status Survey: Is the patient deaf or have difficulty hearing?: No Does the patient have difficulty seeing, even when wearing glasses/contacts?: No Does the patient have difficulty concentrating, remembering, or making decisions?: No Does the patient have difficulty walking or climbing stairs?: No Does the patient have difficulty dressing or bathing?: No Does the patient have difficulty doing errands alone such as visiting a doctor's office or shopping?: No   Assessment & Plan:    CPE completed today  USPSTF grade A and B recommendations reviewed with patient; age-appropriate recommendations, preventive care, screening tests, etc discussed and encouraged; healthy living encouraged; see AVS for patient education given to patient  Discussed importance of 150 minutes of physical activity weekly, AHA exercise recommendations given to pt in AVS/handout  Discussed importance of healthy diet:  eating lean meats and proteins, avoiding trans fats and saturated fats, avoid simple sugars and excessive carbs in diet, eat 6 servings of fruit/vegetables daily and drink plenty of water and avoid sweet beverages.    Recommended pt to do annual eye exam and routine dental exams/cleanings  Depression, alcohol, fall screening completed as documented above and per flowsheets  Advance Care planning information and packet discussed and offered today, encouraged pt to discuss with family members/spouse/partner/friends and complete Advanced directive packet and bring copy to office   Reviewed Health Maintenance: Health Maintenance  Topic Date Due   Cervical Cancer Screening (Pap smear)  02/11/2022   Cervical Cancer Screening (HPV/Pap Cotest)  02/11/2022   COVID-19 Vaccine (2 - 2024-25 season) 11/13/2023 (Originally 11/03/2022)   INFLUENZA VACCINE  06/01/2024 (Originally 10/03/2023)   HPV  VACCINES (1 - 3-dose SCDM series) 08/21/2024 (Originally  06/18/2012)   Hepatitis B Vaccines 19-59 Average Risk (1 of 3 - 19+ 3-dose series) 10/27/2024 (Originally 06/18/2004)   Pneumococcal Vaccine (1 of 2 - PCV) 10/28/2024 (Originally 06/18/2004)   DTaP/Tdap/Td (3 - Td or Tdap) 05/03/2028   Hepatitis C Screening  Completed   HIV Screening  Completed   Meningococcal B Vaccine  Aged Out    Immunizations: Immunization History  Administered Date(s) Administered   Influenza,inj,Quad PF,6+ Mos 05/03/2016, 11/08/2016   Tdap 05/03/2016, 05/04/2018   Unspecified SARS-COV-2 Vaccination 12/01/2019   Vaccines:  HPV: up to at age 31 , ask insurance if age between 53-45  Shingrix: 4-64 yo and ask insurance if covered when patient above 60 yo Pneumonia: due with smoking educated and discussed with patient. Flu: recommend this fall- educated and discussed with patient.     ICD-10-CM   1. Well adult exam  Z00.00     2. Cervical cancer screening  Z12.4 Cytology - PAP   PAP and HPV done today - some tenderness with bimanual exam, cervix healthy appearing no discharge or erythema, consider UG consult    3. 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    4. Hashimoto's thyroiditis  E06.3 levothyroxine  (SYNTHROID ) 25 MCG tablet    5. Thyroid  disease  E07.9 levothyroxine  (SYNTHROID ) 25 MCG tablet   she wishes to wean off levothyroxine  though she has been on it for more than a decade and last time her med dose was decreased she had bothersome SE    6. Unintended weight loss  R63.4    recommended decreasing smoking/avoid increasing will only cause more weight loss, continue pushing additional calories    7. Dyspareunia in female  N94.10    since losing weight, recommended consult with urogyn but pt declined today     Pt wished to stop her thyroid  medication - but she also reported that last time her levothyroxine  dose was decreased from 75 mcg to 50 mcg in just a few days she  had severe SE and lost a lot of weight, has been seen and evaluated many time in the past by endo and discharged by them when they did not know how to help her any more with her sx.  Pt wishes to stop levothyroxine  - warned that she may have similar sx with dose changes and recommend that she do very small dose adjustments very gradually-  F/up for repeat thyroid  labs when she has been on lower/new dose for more than 6 weeks.     Michelene Cower, PA-C 10/29/23 9:40 AM  [facility]

## 2023-10-29 NOTE — Patient Instructions (Signed)
 Health Maintenance  Topic Date Due   Pap Smear  02/11/2022   Pap with HPV screening  02/11/2022   COVID-19 Vaccine (2 - 2024-25 season) 11/13/2023*   Flu Shot  06/01/2024*   HPV Vaccine (1 - 3-dose SCDM series) 08/21/2024*   Hepatitis B Vaccine (1 of 3 - 19+ 3-dose series) 10/27/2024*   Pneumococcal Vaccine (1 of 2 - PCV) 10/28/2024*   DTaP/Tdap/Td vaccine (3 - Td or Tdap) 05/03/2028   Hepatitis C Screening  Completed   HIV Screening  Completed   Meningitis B Vaccine  Aged Out  *Topic was postponed. The date shown is not the original due date.   Follow up in about 6 weeks after you have adjusted your thyroid  doses so we can recheck your labs

## 2023-10-31 ENCOUNTER — Ambulatory Visit: Payer: Self-pay | Admitting: Family Medicine

## 2023-10-31 LAB — CYTOLOGY - PAP
Comment: NEGATIVE
Diagnosis: NEGATIVE
High risk HPV: NEGATIVE

## 2023-11-13 ENCOUNTER — Encounter: Payer: Self-pay | Admitting: Dietician

## 2023-11-13 NOTE — Progress Notes (Signed)
 Have not heard back from patient after she cancelled her MNT follow up visit for 10/08/23. Sent notification to referring provider.

## 2023-12-09 ENCOUNTER — Other Ambulatory Visit: Payer: Self-pay

## 2023-12-09 DIAGNOSIS — E079 Disorder of thyroid, unspecified: Secondary | ICD-10-CM

## 2023-12-09 DIAGNOSIS — E063 Autoimmune thyroiditis: Secondary | ICD-10-CM

## 2023-12-09 LAB — TSH: TSH: 5.54 m[IU]/L — ABNORMAL HIGH

## 2023-12-10 ENCOUNTER — Ambulatory Visit: Admitting: Family Medicine

## 2023-12-10 ENCOUNTER — Ambulatory Visit: Payer: Self-pay | Admitting: Nurse Practitioner

## 2023-12-12 ENCOUNTER — Encounter: Payer: Self-pay | Admitting: Nurse Practitioner

## 2023-12-12 ENCOUNTER — Ambulatory Visit (INDEPENDENT_AMBULATORY_CARE_PROVIDER_SITE_OTHER): Payer: Self-pay | Admitting: Nurse Practitioner

## 2023-12-12 VITALS — BP 136/84 | HR 94 | Resp 16 | Ht 61.0 in | Wt 98.0 lb

## 2023-12-12 DIAGNOSIS — E063 Autoimmune thyroiditis: Secondary | ICD-10-CM | POA: Diagnosis not present

## 2023-12-12 NOTE — Progress Notes (Signed)
 BP 136/84   Pulse 94   Resp 16   Ht 5' 1 (1.549 m)   Wt 98 lb (44.5 kg)   SpO2 98%   BMI 18.52 kg/m    Subjective:    Patient ID: Tamara Walker, female    DOB: [birth year], 38 y.o.   MRN: [id number]  HPI: Tamara Walker is a 38 y.o. female  Chief Complaint  Patient presents with   Follow-up    6 week, abnormal TSH   Discussed the use of AI scribe software for clinical note transcription with the patient, who gave verbal consent to proceed.  History of Present Illness [name] is a 38 year old female with Hashimoto's thyroiditis who presents for a six-week follow-up regarding her thyroid  medication.  Thyroid  dysfunction and medication management - Diagnosed with Hashimoto's thyroiditis since 2016, following the birth of her last child - Currently taking 50 mcg levothyroxine ; previously on 75 mcg - Experienced severe side effects when attempting to decrease levothyroxine  dosage - Recent TSH level 5.54 (previously 1.11 in June 2025) - Has been attempting to wean off levothyroxine  under primary care guidance - Expresses confusion and dissatisfaction with current thyroid  management - Initial response to thyroid  medication was favorable, but subsequent dosage adjustments have led to instability - Previously evaluated by endocrinology, but discharged from her care due to lack of further recommendations - recommend patient go back to original dose of levothyroxine .  Patient declined and said that she is not going to take this medication anymore.             10/29/2023    9:02 AM 09/01/2023   11:04 AM 08/25/2023    8:46 AM  Depression screen PHQ 2/9  Decreased Interest 0 0 3  Down, Depressed, Hopeless 0 0 3  PHQ - 2 Score 0 0 6  Altered sleeping   3  Tired, decreased energy   3  Change in appetite   3  Feeling bad or failure about yourself    3  Trouble concentrating   3  Moving slowly or fidgety/restless   0  Suicidal thoughts   1  PHQ-9 Score   22    Relevant  past medical, surgical, family and social history reviewed and updated as indicated. Interim medical history since our last visit reviewed. Allergies and medications reviewed and updated.  Review of Systems  Ten systems reviewed and is negative except as mentioned in HPI      Objective:      BP 136/84   Pulse 94   Resp 16   Ht 5' 1 (1.549 m)   Wt 98 lb (44.5 kg)   SpO2 98%   BMI 18.52 kg/m    Wt Readings from Last 3 Encounters:  12/12/23 98 lb (44.5 kg)  10/29/23 99 lb (44.9 kg)  09/01/23 98 lb (44.5 kg)    Physical Exam MEASUREMENTS: Weight- 98. GENERAL: Alert, cooperative, well developed, no acute distress Awake alert and oriented,  respirations normal, non labored Heart rate normal  Results for orders placed or performed in visit on 12/09/23  TSH   Collection Time: 12/09/23  8:35 AM  Result Value Ref Range   TSH 5.54 (H) mIU/L          Assessment & Plan:   Problem List Items Addressed This Visit       Endocrine   Hypothyroidism due to Hashimoto thyroiditis - Primary     Assessment and Plan Assessment & Plan Hypothyroidism due to Hashimoto's  thyroiditis Chronic hypothyroidism secondary to Hashimoto's thyroiditis with elevated TSH at 5.54, indicating suboptimal thyroid  hormone replacement. Previously well-managed on 75 mcg of levothyroxine , currently on 50 mcg. She reports significant weight loss and dissatisfaction with current treatment, expressing a desire to discontinue levothyroxine  and explore alternative supplements. She experienced severe side effects with previous attempts to decrease levothyroxine . Endocrinology has discharged her, stating no further assistance could be provided. Differential diagnosis for weight loss includes non-thyroidal causes, as multiple evaluations have not identified a thyroid -related cause. She understands the necessity of thyroid  hormone replacement due to her body's inability to produce it naturally. - Consider adjusting  levothyroxine  dosage based on TSH levels and clinical symptoms.  Patient was very irate during the appointment, she was frustrated that she did not see her PCP and was upset that I recommend she return to previous dosage of levothyroxine .  Patient then walked out of office saying this was a waist of her time and she wanted her money back.       Follow up plan: No follow-ups on file.

## 2024-02-02 ENCOUNTER — Ambulatory Visit: Admitting: Family Medicine

## 2024-03-10 ENCOUNTER — Encounter: Payer: Self-pay | Admitting: Family Medicine

## 2024-03-10 ENCOUNTER — Ambulatory Visit (INDEPENDENT_AMBULATORY_CARE_PROVIDER_SITE_OTHER): Admitting: Family Medicine

## 2024-03-10 VITALS — BP 128/74 | HR 58 | Resp 16 | Ht 61.0 in | Wt 101.0 lb

## 2024-03-10 DIAGNOSIS — Z833 Family history of diabetes mellitus: Secondary | ICD-10-CM

## 2024-03-10 DIAGNOSIS — Z8639 Personal history of other endocrine, nutritional and metabolic disease: Secondary | ICD-10-CM | POA: Diagnosis not present

## 2024-03-10 DIAGNOSIS — E611 Iron deficiency: Secondary | ICD-10-CM | POA: Diagnosis not present

## 2024-03-10 DIAGNOSIS — F411 Generalized anxiety disorder: Secondary | ICD-10-CM

## 2024-03-10 DIAGNOSIS — F322 Major depressive disorder, single episode, severe without psychotic features: Secondary | ICD-10-CM

## 2024-03-10 DIAGNOSIS — E559 Vitamin D deficiency, unspecified: Secondary | ICD-10-CM | POA: Diagnosis not present

## 2024-03-10 DIAGNOSIS — Z862 Personal history of diseases of the blood and blood-forming organs and certain disorders involving the immune mechanism: Secondary | ICD-10-CM | POA: Diagnosis not present

## 2024-03-10 DIAGNOSIS — E063 Autoimmune thyroiditis: Secondary | ICD-10-CM | POA: Diagnosis not present

## 2024-03-10 DIAGNOSIS — E782 Mixed hyperlipidemia: Secondary | ICD-10-CM | POA: Diagnosis not present

## 2024-03-10 DIAGNOSIS — R634 Abnormal weight loss: Secondary | ICD-10-CM

## 2024-03-10 NOTE — Assessment & Plan Note (Addendum)
 Vitamin D  deficiency with last vitamin D  level of 26 in 10/2020. Currently taking OTC Multivitamin supplement ([employer])  -Update labs Orders:   Vitamin D  (25 hydroxy)

## 2024-03-10 NOTE — Progress Notes (Signed)
 "  Established Patient Office Visit  Subjective   Patient ID: Tamara Walker, female    DOB: 1986-02-02  Age: 39 y.o. MRN: [id number]  Chief Complaint  Patient presents with   Transitions Of Care    HPI  Tamara Walker is a 39 year old female who is here today for chronic condition follow up and transition of care. She is a new patient to me.   She has been off of Levothyroxine  for the last one month. She voices she began concerned after a dose adjustment in [place]  and she lost a significant amount of weight. She voices at one time Levothyroxine  worked well for her, typically or , but when her dose was reduced to 50mcg she did not do well. She had previously seen endocrinology and voices that she was told that her thyroid  was not the cause of her weight loss. She voices she has been taking supplements to try to naturally control her thyroid  disorder. She voices since being off of the medicine that her throat has felt swollen, and reports hx of thyroid  nodule. She was diagnosed with hypothyroidism in 2016 when initial ultrasound was completed.   She is frustrated with the weight loss. She voices episodes of abdominal pain, not currently. She does endorse complaints of constipation, likely worsened by uncontrolled hypothyroidism. She voices her last episode of abdominal pain was last night. She voices pain with defecation and states she had multiple bowel movements last night. She does endorse associated nausea, denies vomiting. Nausea with bowel movements but also without bowel movements. She does admit poor appetite but states she does eat, often trying to force extra calories. She denies hematochezia or melena.  Family hx of diabetes- paternal grandmother, maternal grandfather  Anxiety and depression uncontrolled. She does endorse SI but denies plan or intention of harming self. She names that she has taken Sertraline  and Hydroxyzine  and this was not helpful. She cannot recall other  medications stating she does not remember if her or her daughter took some of the medications. Reviewing her chart the only other medications listed tried and failed include Buspirone  and Lunesta . She again is unsure stating her memory has been poor and with stressors noted above this has caused some difficulty. She was in [place]  for two years and anxiety was managed with Xanax. She states Xanax was the first thing that had worked for her anxiety. She relocated from [place]  7 months ago.     03/10/2024    9:16 AM 10/29/2023    9:02 AM 09/01/2023   11:04 AM  Depression screen PHQ 2/9  Decreased Interest 3 0 0  Down, Depressed, Hopeless 3 0 0  PHQ - 2 Score 6 0 0  Altered sleeping 3    Tired, decreased energy 3    Change in appetite 3    Feeling bad or failure about yourself  3    Trouble concentrating 3    Moving slowly or fidgety/restless 0    Suicidal thoughts 1    PHQ-9 Score 22         03/10/2024    9:16 AM 08/25/2023    8:49 AM 03/16/2021    2:24 PM 05/31/2020   11:08 AM  GAD 7 : Generalized Anxiety Score  Nervous, Anxious, on Edge 3 3 3 3   Control/stop worrying 3 3 2 2   Worry too much - different things 3 3 2 3   Trouble relaxing 3 3 3 3   Restless 3 3 3 3   Easily  annoyed or irritable 3 3 3 3   Afraid - awful might happen 3 0 1 0  Total GAD 7 Score 21 18 17 17   Anxiety Difficulty   Somewhat difficult Somewhat difficult      Review of Systems  Constitutional:  Positive for malaise/fatigue and weight loss.  Gastrointestinal:  Positive for abdominal pain, constipation and nausea. Negative for blood in stool, melena and vomiting.  Psychiatric/Behavioral:  Positive for depression and suicidal ideas. The patient is nervous/anxious.       Objective:     BP 128/74   Pulse (!) 58   Resp 16   Ht 5' 1 (1.549 m)   Wt 101 lb (45.8 kg)   SpO2 99%   BMI 19.08 kg/m  BP Readings from Last 3 Encounters:  03/10/24 128/74  12/12/23 136/84  10/29/23 118/76   Wt Readings from Last  3 Encounters:  03/10/24 101 lb (45.8 kg)  12/12/23 98 lb (44.5 kg)  10/29/23 99 lb (44.9 kg)      Physical Exam Constitutional:      General: She is not in acute distress.    Appearance: Normal appearance. She is not ill-appearing or toxic-appearing.  HENT:     Head: Normocephalic and atraumatic.  Eyes:     Conjunctiva/sclera: Conjunctivae normal.     Pupils: Pupils are equal, round, and reactive to light.  Neck:     Thyroid : Thyromegaly present.  Cardiovascular:     Rate and Rhythm: Normal rate and regular rhythm.  Pulmonary:     Effort: Pulmonary effort is normal. No respiratory distress.     Breath sounds: Normal breath sounds.  Abdominal:     General: Abdomen is flat. Bowel sounds are normal.     Palpations: Abdomen is soft.     Tenderness: There is abdominal tenderness. There is guarding.  Musculoskeletal:     Cervical back: Normal range of motion and neck supple.     Right lower leg: No edema.     Left lower leg: No edema.  Lymphadenopathy:     Head:     Right side of head: No submental, submandibular, tonsillar, preauricular or posterior auricular adenopathy.     Left side of head: No submental, submandibular, tonsillar, preauricular or posterior auricular adenopathy.     Cervical: No cervical adenopathy.     Upper Body:     Right upper body: No supraclavicular adenopathy.     Left upper body: No supraclavicular adenopathy.  Skin:    General: Skin is warm and dry.  Neurological:     General: No focal deficit present.     Mental Status: She is alert.  Psychiatric:        Mood and Affect: Mood normal.        Behavior: Behavior normal.     Last CBC Lab Results  Component Value Date   WBC 7.8 08/25/2023   HGB 13.0 08/25/2023   HCT 43.8 08/25/2023   MCV 79.3 (L) 08/25/2023   MCH 23.6 (L) 08/25/2023   RDW 12.7 08/25/2023   PLT 324 08/25/2023   Last metabolic panel Lab Results  Component Value Date   GLUCOSE 110 (H) 08/25/2023   NA 138 08/25/2023   K  3.8 08/25/2023   CL 103 08/25/2023   CO2 26 08/25/2023   BUN 9 08/25/2023   CREATININE 0.77 08/25/2023   EGFR 101 08/25/2023   CALCIUM  10.1 08/25/2023   PROT 7.6 08/25/2023   ALBUMIN 4.4 01/15/2019   BILITOT 0.7 08/25/2023  ALKPHOS 77 01/15/2019   AST 15 08/25/2023   ALT 12 08/25/2023   Last lipids Lab Results  Component Value Date   CHOL 200 (H) 08/25/2023   HDL 57 08/25/2023   LDLCALC 120 (H) 08/25/2023   TRIG 122 08/25/2023   CHOLHDL 3.5 08/25/2023   Last hemoglobin A1c Lab Results  Component Value Date   HGBA1C 5.5 08/25/2023   Last thyroid  functions Lab Results  Component Value Date   TSH 5.54 (H) 12/09/2023   T4TOTAL 9.4 03/16/2021   FREET4 1.2 10/17/2020   Last vitamin D  Lab Results  Component Value Date   VD25OH 26 (L) 10/17/2020          Assessment & Plan:   Assessment & Plan Hypothyroidism due to Hashimoto thyroiditis Hypothyroidism s/t Hashimoto diagnosed in 2016 per patient report. She endorses concerns of fatigue and weight loss. ROS positive for depressed mood, anxiety and SI. Suspect that depression and anxiety are negatively impacted by uncontrolled hypothyroidism. She has been off of Levothyroxine  for the last one month, and previously weaning dose from 75mcg. She has seen endocrinology in the past and no significant findings in work up.   Mild bradycardia otherwise v/s stable. Bilateral lower extremities non-edematous.  Last TSH 5.54 in 12/2023.   -Update labs -Consider restarting Levothyroxine  25mcg vs NP thyroid . Discuss in more detail at next f/u in 2 months Orders:   US  THYROID ; Future   Thyroid  Panel With TSH  History of thyroid  nodule Hx of thyroid  nodule which patient reports led to her diagnosis in 2016.   Physical exam findings include enlarged thyroid .   -Thyroid  ultrasound ordered -Update labs -2 month f/u  Orders:   US  THYROID ; Future   Thyroid  Panel With TSH  Unintended weight loss She voices concerns of  unintentional weight loss. She does endorse poor appetite but describes forcing extra calories as she can. Other red flag symptoms include nausea, abdominal pain and pain with defecation. She does endorse hx of constipation, suspect that this is also worsened due to uncontrolled hypothyroidism. Denies melena or presence of blood in the stool.   Noted family hx of diabetes. Consider A1c to rule out type 1 DM with hx of autoimmune condition and family hx of diabetes  -Refer to GI for further work up Orders:   CBC w/Diff/Platelet   Comprehensive Metabolic Panel (CMET)   HgB A1c   Ambulatory referral to Gastroenterology  Mixed hyperlipidemia Reviewing last lipid panel hyperlipidemia uncontrolled with LDL of 120 in 08/2023.  -Update labs Orders:   Comprehensive Metabolic Panel (CMET)   Lipid Profile  Iron deficiency Hx of iron deficiency. She endorses complaints of fatigue.   -Update labs Orders:   CBC w/Diff/Platelet   Fe+TIBC+Fer  Vitamin D  deficiency Vitamin D  deficiency with last vitamin D  level of 26 in 10/2020. Currently taking OTC Multivitamin supplement ([employer])  -Update labs Orders:   Vitamin D  (25 hydroxy)  History of anemia due to vitamin B12 deficiency Hx of B12 deficiency anemia. Not able to see recent B12 level. As noted above, she endorses complaints of fatigue with hx of B12 deficiency.  -Update labs Orders:   CBC w/Diff/Platelet   B12 and Folate Panel  Generalized anxiety disorder GAD uncontrolled. At this time she is not interested in trying other medications due to previous trial and failed medications including Hydroxyzine , Buspar  and Sertraline . She received a prescription for Xanax in [place]  and voices this is the first medication that worked for her.   -Discussed option  to try Celexa, she declines. Will f/u in 2 months -Referral to psychiatry for management of anxiety and depression Orders:   Ambulatory referral to Psychiatry  Severe major  depression (HCC) Uncontrolled severe major depression, see PHQ-9. Will note she does endorse SI but denies plan or intention to act on thoughts.  Tried and failed medications include Sertraline . She believes she has also taken Lexapro in the past. Several stressors affecting mood and anxiety.   -Discussed option to try Celexa, declined at this time. Will f/u in 2 months -Referral to psychiatry for management of depression and anxiety  Orders:   Ambulatory referral to Psychiatry  Family history of diabetes mellitus (DM) Family hx of diabetes affecting paternal grandmother and maternal grandmother as per HPI  -Screen for diabetes, A1c ordered Orders:   HgB A1c      Return in about 2 months (around 05/08/2024).    LAYMON LOISE CORE, FNP "

## 2024-03-10 NOTE — Assessment & Plan Note (Addendum)
 Reviewing last lipid panel hyperlipidemia uncontrolled with LDL of 120 in 08/2023.  -Update labs Orders:   Comprehensive Metabolic Panel (CMET)   Lipid Profile

## 2024-03-10 NOTE — Assessment & Plan Note (Addendum)
 Hypothyroidism s/t Hashimoto diagnosed in 2016 per patient report. She endorses concerns of fatigue and weight loss. ROS positive for depressed mood, anxiety and SI. Suspect that depression and anxiety are negatively impacted by uncontrolled hypothyroidism. She has been off of Levothyroxine  for the last one month, and previously weaning dose from 75mcg. She has seen endocrinology in the past and no significant findings in work up.   Mild bradycardia otherwise v/s stable. Bilateral lower extremities non-edematous.  Last TSH 5.54 in 12/2023.   -Update labs -Consider restarting Levothyroxine  25mcg vs NP thyroid . Discuss in more detail at next f/u in 2 months Orders:   US  THYROID ; Future   Thyroid  Panel With TSH

## 2024-03-11 ENCOUNTER — Ambulatory Visit: Payer: Self-pay | Admitting: Family Medicine

## 2024-03-11 ENCOUNTER — Ambulatory Visit
Admission: RE | Admit: 2024-03-11 | Discharge: 2024-03-11 | Disposition: A | Discharge status: Home / Self Care | Source: Ambulatory Visit | Attending: Family Medicine | Admitting: Family Medicine

## 2024-03-11 DIAGNOSIS — E063 Autoimmune thyroiditis: Secondary | ICD-10-CM | POA: Insufficient documentation

## 2024-03-11 DIAGNOSIS — Z8639 Personal history of other endocrine, nutritional and metabolic disease: Secondary | ICD-10-CM | POA: Insufficient documentation

## 2024-03-11 LAB — COMPREHENSIVE METABOLIC PANEL WITH GFR
AG Ratio: 2 (calc) (ref 1.0–2.5)
ALT: 13 U/L (ref 6–29)
AST: 18 U/L (ref 10–30)
Albumin: 4.7 g/dL (ref 3.6–5.1)
Alkaline phosphatase (APISO): 77 U/L (ref 31–125)
BUN: 7 mg/dL (ref 7–25)
CO2: 30 mmol/L (ref 20–32)
Calcium: 9.4 mg/dL (ref 8.6–10.2)
Chloride: 102 mmol/L (ref 98–110)
Creat: 0.6 mg/dL (ref 0.50–0.97)
Globulin: 2.3 g/dL (ref 1.9–3.7)
Glucose, Bld: 101 mg/dL — ABNORMAL HIGH (ref 65–99)
Potassium: 4.2 mmol/L (ref 3.5–5.3)
Sodium: 138 mmol/L (ref 135–146)
Total Bilirubin: 0.4 mg/dL (ref 0.2–1.2)
Total Protein: 7 g/dL (ref 6.1–8.1)
eGFR: 118 mL/min/1.73m2

## 2024-03-11 LAB — IRON,TIBC AND FERRITIN PANEL
%SAT: 21 % (ref 16–45)
Ferritin: 24 ng/mL (ref 16–154)
Iron: 76 ug/dL (ref 40–190)
TIBC: 365 ug/dL (ref 250–450)

## 2024-03-11 LAB — HEMOGLOBIN A1C
Hgb A1c MFr Bld: 5.6 %
Mean Plasma Glucose: 114 mg/dL
eAG (mmol/L): 6.3 mmol/L

## 2024-03-11 LAB — CBC WITH DIFFERENTIAL/PLATELET
Absolute Lymphocytes: 2862 {cells}/uL (ref 850–3900)
Absolute Monocytes: 398 {cells}/uL (ref 200–950)
Basophils Absolute: 39 {cells}/uL (ref 0–200)
Basophils Relative: 0.4 %
Eosinophils Absolute: 213 {cells}/uL (ref 15–500)
Eosinophils Relative: 2.2 %
HCT: 39.8 % (ref 35.9–46.0)
Hemoglobin: 12.2 g/dL (ref 11.7–15.5)
MCH: 23.8 pg — ABNORMAL LOW (ref 27.0–33.0)
MCHC: 30.7 g/dL — ABNORMAL LOW (ref 31.6–35.4)
MCV: 77.6 fL — ABNORMAL LOW (ref 81.4–101.7)
MPV: 10.1 fL (ref 7.5–12.5)
Monocytes Relative: 4.1 %
Neutro Abs: 6189 {cells}/uL (ref 1500–7800)
Neutrophils Relative %: 63.8 %
Platelets: 308 Thousand/uL (ref 140–400)
RBC: 5.13 Million/uL — ABNORMAL HIGH (ref 3.80–5.10)
RDW: 13.7 % (ref 11.0–15.0)
Total Lymphocyte: 29.5 %
WBC: 9.7 Thousand/uL (ref 3.8–10.8)

## 2024-03-11 LAB — THYROID PANEL WITH TSH
Free Thyroxine Index: 1.5 (ref 1.4–3.8)
T3 Uptake: 27 % (ref 22–35)
T4, Total: 5.4 ug/dL (ref 5.1–11.9)
TSH: 14.83 m[IU]/L — ABNORMAL HIGH

## 2024-03-11 LAB — B12 AND FOLATE PANEL
Folate: >24 ng/mL
Vitamin B-12: 1064 pg/mL (ref 200–1100)

## 2024-03-11 LAB — LIPID PANEL
Cholesterol: 246 mg/dL — ABNORMAL HIGH
HDL: 67 mg/dL
LDL Cholesterol (Calc): 152 mg/dL — ABNORMAL HIGH
Non-HDL Cholesterol (Calc): 179 mg/dL — ABNORMAL HIGH
Total CHOL/HDL Ratio: 3.7 (calc)
Triglycerides: 148 mg/dL

## 2024-03-11 LAB — VITAMIN D 25 HYDROXY (VIT D DEFICIENCY, FRACTURES): Vit D, 25-Hydroxy: 33 ng/mL (ref 30–100)

## 2024-04-13 ENCOUNTER — Ambulatory Visit

## 2024-05-10 ENCOUNTER — Ambulatory Visit: Admitting: Family Medicine
